# Patient Record
Sex: Male | Born: 1970 | Race: White | Hispanic: No | Marital: Single | Smoking: Former smoker
Health system: Southern US, Community
[De-identification: ages and names within clinical notes are randomized; demographics above are authoritative.]

## PROBLEM LIST (undated history)

## (undated) DIAGNOSIS — K219 Gastro-esophageal reflux disease without esophagitis: Secondary | ICD-10-CM

## (undated) DIAGNOSIS — F329 Major depressive disorder, single episode, unspecified: Secondary | ICD-10-CM

## (undated) DIAGNOSIS — F32A Depression, unspecified: Secondary | ICD-10-CM

## (undated) DIAGNOSIS — I10 Essential (primary) hypertension: Secondary | ICD-10-CM

## (undated) DIAGNOSIS — E785 Hyperlipidemia, unspecified: Secondary | ICD-10-CM

## (undated) DIAGNOSIS — F419 Anxiety disorder, unspecified: Secondary | ICD-10-CM

## (undated) DIAGNOSIS — E119 Type 2 diabetes mellitus without complications: Secondary | ICD-10-CM

## (undated) HISTORY — DX: Depression, unspecified: F32.A

## (undated) HISTORY — PX: UPPER GASTROINTESTINAL ENDOSCOPY: SHX188

## (undated) HISTORY — DX: Essential (primary) hypertension: I10

## (undated) HISTORY — DX: Major depressive disorder, single episode, unspecified: F32.9

## (undated) HISTORY — DX: Anxiety disorder, unspecified: F41.9

## (undated) HISTORY — DX: Type 2 diabetes mellitus without complications: E11.9

## (undated) HISTORY — DX: Hyperlipidemia, unspecified: E78.5

## (undated) HISTORY — DX: Gastro-esophageal reflux disease without esophagitis: K21.9

---

## 2004-03-04 ENCOUNTER — Ambulatory Visit: Payer: Self-pay | Admitting: General Surgery

## 2004-06-08 ENCOUNTER — Ambulatory Visit: Payer: Self-pay

## 2004-07-01 ENCOUNTER — Ambulatory Visit: Payer: Self-pay

## 2006-09-14 ENCOUNTER — Ambulatory Visit: Payer: Self-pay

## 2014-12-13 ENCOUNTER — Other Ambulatory Visit: Payer: Self-pay | Admitting: Family Medicine

## 2015-02-03 DIAGNOSIS — E785 Hyperlipidemia, unspecified: Secondary | ICD-10-CM | POA: Insufficient documentation

## 2015-02-03 DIAGNOSIS — F329 Major depressive disorder, single episode, unspecified: Secondary | ICD-10-CM | POA: Insufficient documentation

## 2015-02-03 DIAGNOSIS — F331 Major depressive disorder, recurrent, moderate: Secondary | ICD-10-CM | POA: Insufficient documentation

## 2015-02-03 DIAGNOSIS — I10 Essential (primary) hypertension: Secondary | ICD-10-CM | POA: Insufficient documentation

## 2015-02-03 DIAGNOSIS — F419 Anxiety disorder, unspecified: Secondary | ICD-10-CM | POA: Insufficient documentation

## 2015-02-03 DIAGNOSIS — F32A Depression, unspecified: Secondary | ICD-10-CM | POA: Insufficient documentation

## 2015-02-03 DIAGNOSIS — K219 Gastro-esophageal reflux disease without esophagitis: Secondary | ICD-10-CM | POA: Insufficient documentation

## 2015-02-10 ENCOUNTER — Ambulatory Visit (INDEPENDENT_AMBULATORY_CARE_PROVIDER_SITE_OTHER): Payer: Managed Care, Other (non HMO) | Admitting: Family Medicine

## 2015-02-10 ENCOUNTER — Encounter: Payer: Self-pay | Admitting: Family Medicine

## 2015-02-10 VITALS — BP 148/91 | HR 88 | Temp 97.9°F | Ht 70.2 in | Wt 283.0 lb

## 2015-02-10 DIAGNOSIS — F419 Anxiety disorder, unspecified: Secondary | ICD-10-CM | POA: Diagnosis not present

## 2015-02-10 DIAGNOSIS — Z0000 Encounter for general adult medical examination without abnormal findings: Secondary | ICD-10-CM

## 2015-02-10 DIAGNOSIS — I10 Essential (primary) hypertension: Secondary | ICD-10-CM

## 2015-02-10 DIAGNOSIS — E785 Hyperlipidemia, unspecified: Secondary | ICD-10-CM

## 2015-02-10 DIAGNOSIS — K219 Gastro-esophageal reflux disease without esophagitis: Secondary | ICD-10-CM | POA: Diagnosis not present

## 2015-02-10 DIAGNOSIS — F32A Depression, unspecified: Secondary | ICD-10-CM

## 2015-02-10 DIAGNOSIS — F329 Major depressive disorder, single episode, unspecified: Secondary | ICD-10-CM | POA: Diagnosis not present

## 2015-02-10 LAB — MICROSCOPIC EXAMINATION
Epithelial Cells (non renal): NONE SEEN /hpf (ref 0–10)
Renal Epithel, UA: NONE SEEN /hpf
WBC, UA: NONE SEEN /hpf (ref 0–?)

## 2015-02-10 LAB — URINALYSIS, ROUTINE W REFLEX MICROSCOPIC
Bilirubin, UA: NEGATIVE
Glucose, UA: NEGATIVE
Ketones, UA: NEGATIVE
Leukocytes, UA: NEGATIVE
Nitrite, UA: NEGATIVE
RBC, UA: NEGATIVE
Specific Gravity, UA: 1.02 (ref 1.005–1.030)
Urobilinogen, Ur: 0.2 mg/dL (ref 0.2–1.0)
pH, UA: 6.5 (ref 5.0–7.5)

## 2015-02-10 MED ORDER — DULOXETINE HCL 60 MG PO CPEP: 60.0000 mg | ORAL_CAPSULE | Freq: Every day | ORAL | Status: DC

## 2015-02-10 MED ORDER — ATENOLOL 100 MG PO TABS: 100.0000 mg | ORAL_TABLET | Freq: Every day | ORAL | Status: DC

## 2015-02-10 MED ORDER — ESOMEPRAZOLE MAGNESIUM 40 MG PO CPDR: 40.0000 mg | DELAYED_RELEASE_CAPSULE | Freq: Two times a day (BID) | ORAL | Status: DC

## 2015-02-10 MED ORDER — BENAZEPRIL HCL 40 MG PO TABS: 40.0000 mg | ORAL_TABLET | Freq: Every day | ORAL | Status: DC

## 2015-02-10 NOTE — Assessment & Plan Note (Signed)
His blood pressure being elevated will begin benazepril Patient education on diet exercise weight loss

## 2015-02-10 NOTE — Assessment & Plan Note (Signed)
Patient takes rare clonazepam

## 2015-02-10 NOTE — Assessment & Plan Note (Signed)
Diet controlled.  

## 2015-02-10 NOTE — Assessment & Plan Note (Signed)
The current medical regimen is effective;  continue present plan and medications.  

## 2015-02-10 NOTE — Progress Notes (Signed)
BP 148/91 mmHg  Pulse 88  Temp(Src) 97.9 F (36.6 C)  Ht 5' 10.2" (1.783 m)  Wt 283 lb (128.368 kg)  BMI 40.38 kg/m2  SpO2 98%   Subjective:    Patient ID: Aaron Robinson, male    DOB: 1970-08-08, 44 y.o.   MRN: [id number]  HPI: Aaron Robinson is a 44 y.o. male  Chief Complaint  Patient presents with  . Annual Exam   patient blood pressure with home checks sometimes elevated, sometimes doing well but mostly the lowest he gets is 140/85. No other complaints or concerns wants to get homocystine checked a cause family history of coronary artery disease.  Relevant past medical, surgical, family and social history reviewed and updated as indicated. Interim medical history since our last visit reviewed. Allergies and medications reviewed and updated.  Review of Systems  Constitutional: Negative.   HENT: Negative.   Eyes: Negative.   Respiratory: Negative.   Cardiovascular: Negative.   Endocrine: Negative.   Musculoskeletal: Negative.   Skin: Negative.   Allergic/Immunologic: Negative.   Neurological: Negative.   Hematological: Negative.   Psychiatric/Behavioral: Negative.     Per HPI unless specifically indicated above     Objective:    BP 148/91 mmHg  Pulse 88  Temp(Src) 97.9 F (36.6 C)  Ht 5' 10.2" (1.783 m)  Wt 283 lb (128.368 kg)  BMI 40.38 kg/m2  SpO2 98%  Wt Readings from Last 3 Encounters:  02/10/15 283 lb (128.368 kg)  05/29/14 277 lb (125.646 kg)    Physical Exam  Constitutional: He is oriented to person, place, and time. He appears well-developed and well-nourished.  HENT:  Head: Normocephalic and atraumatic.  Right Ear: External ear normal.  Left Ear: External ear normal.  Eyes: Conjunctivae and EOM are normal. Pupils are equal, round, and reactive to light.  Neck: Normal range of motion. Neck supple.  Cardiovascular: Normal rate, regular rhythm, normal heart sounds and intact distal pulses.   Pulmonary/Chest: Effort normal and breath  sounds normal.  Abdominal: Soft. Bowel sounds are normal. There is no splenomegaly or hepatomegaly.  Genitourinary: Rectum normal, prostate normal and penis normal.  Musculoskeletal: Normal range of motion.  Neurological: He is alert and oriented to person, place, and time. He has normal reflexes.  Skin: No rash noted. No erythema.  Psychiatric: He has a normal mood and affect. His behavior is normal. Judgment and thought content normal.    No results found for this or any previous visit.    Assessment & Plan:   Problem List Items Addressed This Visit      Cardiovascular and Mediastinum   Hypertension - Primary    His blood pressure being elevated will begin benazepril Patient education on diet exercise weight loss       Relevant Medications   atenolol (TENORMIN) 100 MG tablet   benazepril (LOTENSIN) 40 MG tablet     Digestive   GERD (gastroesophageal reflux disease)    The current medical regimen is effective;  continue present plan and medications.       Relevant Medications   esomeprazole (NEXIUM) 40 MG capsule     Other   Anxiety    Patient takes rare clonazepam      Relevant Medications   DULoxetine (CYMBALTA) 60 MG capsule   Hyperlipidemia    Diet-controlled      Relevant Medications   atenolol (TENORMIN) 100 MG tablet   benazepril (LOTENSIN) 40 MG tablet   Other Relevant Orders  Homocysteine   Depression    The current medical regimen is effective;  continue present plan and medications.       Relevant Medications   DULoxetine (CYMBALTA) 60 MG capsule    Other Visit Diagnoses    PE (physical exam), annual        Relevant Orders    Comprehensive metabolic panel    Lipid panel    CBC with Differential/Platelet    TSH    Urinalysis, Routine w reflex microscopic (not at [facility])    PSA        Follow up plan: Return in about 4 weeks (around 03/10/2015) for for BP check and BMP.

## 2015-02-11 ENCOUNTER — Encounter: Payer: Self-pay | Admitting: Family Medicine

## 2015-02-11 LAB — CBC WITH DIFFERENTIAL/PLATELET
Basophils Absolute: 0 10*3/uL (ref 0.0–0.2)
Basos: 0 %
EOS (ABSOLUTE): 0.4 10*3/uL (ref 0.0–0.4)
Eos: 4 %
Hematocrit: 45 % (ref 37.5–51.0)
Hemoglobin: 15.1 g/dL (ref 12.6–17.7)
Immature Grans (Abs): 0 10*3/uL (ref 0.0–0.1)
Immature Granulocytes: 0 %
Lymphocytes Absolute: 2.1 10*3/uL (ref 0.7–3.1)
Lymphs: 23 %
MCH: 27.6 pg (ref 26.6–33.0)
MCHC: 33.6 g/dL (ref 31.5–35.7)
MCV: 82 fL (ref 79–97)
Monocytes Absolute: 0.6 10*3/uL (ref 0.1–0.9)
Monocytes: 6 %
Neutrophils Absolute: 5.9 10*3/uL (ref 1.4–7.0)
Neutrophils: 67 %
Platelets: 275 10*3/uL (ref 150–379)
RBC: 5.48 x10E6/uL (ref 4.14–5.80)
RDW: 14.5 % (ref 12.3–15.4)
WBC: 9 10*3/uL (ref 3.4–10.8)

## 2015-02-11 LAB — COMPREHENSIVE METABOLIC PANEL
ALT: 34 IU/L (ref 0–44)
AST: 26 IU/L (ref 0–40)
Albumin/Globulin Ratio: 1.5 (ref 1.1–2.5)
Albumin: 4.6 g/dL (ref 3.5–5.5)
Alkaline Phosphatase: 70 IU/L (ref 39–117)
BUN/Creatinine Ratio: 10 (ref 9–20)
BUN: 10 mg/dL (ref 6–24)
Bilirubin Total: 0.5 mg/dL (ref 0.0–1.2)
CO2: 26 mmol/L (ref 18–29)
Calcium: 9.8 mg/dL (ref 8.7–10.2)
Chloride: 96 mmol/L — ABNORMAL LOW (ref 97–108)
Creatinine, Ser: 0.97 mg/dL (ref 0.76–1.27)
GFR calc Af Amer: 109 mL/min/{1.73_m2} (ref >59)
GFR calc non Af Amer: 95 mL/min/{1.73_m2} (ref >59)
Globulin, Total: 3 g/dL (ref 1.5–4.5)
Glucose: 121 mg/dL — ABNORMAL HIGH (ref 65–99)
Potassium: 4.9 mmol/L (ref 3.5–5.2)
Sodium: 139 mmol/L (ref 134–144)
Total Protein: 7.6 g/dL (ref 6.0–8.5)

## 2015-02-11 LAB — LIPID PANEL
Chol/HDL Ratio: 3.9 ratio units (ref 0.0–5.0)
Cholesterol, Total: 165 mg/dL (ref 100–199)
HDL: 42 mg/dL (ref >39)
LDL Calculated: 90 mg/dL (ref 0–99)
Triglycerides: 163 mg/dL — ABNORMAL HIGH (ref 0–149)
VLDL Cholesterol Cal: 33 mg/dL (ref 5–40)

## 2015-02-11 LAB — TSH: TSH: 1.42 u[IU]/mL (ref 0.450–4.500)

## 2015-02-11 LAB — HOMOCYSTEINE: Homocysteine: 9.4 umol/L (ref 0.0–15.0)

## 2015-02-11 LAB — PSA: Prostate Specific Ag, Serum: 0.5 ng/mL (ref 0.0–4.0)

## 2015-02-19 ENCOUNTER — Telehealth: Payer: Self-pay | Admitting: Family Medicine

## 2015-02-19 NOTE — Telephone Encounter (Signed)
Pt requests call back from [facility]. No further information provided. Thanks.

## 2015-03-13 ENCOUNTER — Encounter: Payer: Self-pay | Admitting: Family Medicine

## 2015-03-13 ENCOUNTER — Ambulatory Visit (INDEPENDENT_AMBULATORY_CARE_PROVIDER_SITE_OTHER): Payer: Managed Care, Other (non HMO) | Admitting: Family Medicine

## 2015-03-13 VITALS — BP 120/78 | HR 70 | Temp 97.9°F | Ht 70.0 in | Wt 280.0 lb

## 2015-03-13 DIAGNOSIS — I10 Essential (primary) hypertension: Secondary | ICD-10-CM

## 2015-03-13 MED ORDER — BENAZEPRIL HCL 20 MG PO TABS: 20.0000 mg | ORAL_TABLET | Freq: Every day | ORAL | Status: DC

## 2015-03-13 NOTE — Assessment & Plan Note (Signed)
Discussed blood pressure doing really well patient will continue lifestyle changes weight loss diet exercise nutrition Will do a trial of 20 mg benazepril observing blood pressure We'll give refills for 6 months with 20 mg but may need to change back to 40 depending patient's blood pressure response.

## 2015-03-13 NOTE — Progress Notes (Signed)
BP 120/78 mmHg  Pulse 70  Temp(Src) 97.9 F (36.6 C)  Ht 5\' 10"  (1.778 m)  Wt 280 lb (127.007 kg)  BMI 40.18 kg/m2  SpO2 97%   Subjective:    Patient ID: Aaron Robinson, male    DOB: 01/21/1971, 44 y.o.   MRN: [id number]  HPI: Aaron Robinson is a 44 y.o. male  Chief Complaint  Patient presents with  . Hypertension   recheck blood pressures doing well with no side effects taking medications faithfully every day is interested in cutting down to 20 mg is already lost 3 pounds since last visit.  Relevant past medical, surgical, family and social history reviewed and updated as indicated. Interim medical history since our last visit reviewed. Allergies and medications reviewed and updated.  Review of Systems Negative Per HPI unless specifically indicated above     Objective:    BP 120/78 mmHg  Pulse 70  Temp(Src) 97.9 F (36.6 C)  Ht 5\' 10"  (1.778 m)  Wt 280 lb (127.007 kg)  BMI 40.18 kg/m2  SpO2 97%  Wt Readings from Last 3 Encounters:  03/13/15 280 lb (127.007 kg)  02/10/15 283 lb (128.368 kg)  05/29/14 277 lb (125.646 kg)    Physical Exam  Constitutional: He is oriented to person, place, and time. He appears well-developed and well-nourished. No distress.  HENT:  Head: Normocephalic and atraumatic.  Right Ear: Hearing normal.  Left Ear: Hearing normal.  Nose: Nose normal.  Eyes: Conjunctivae and lids are normal. Right eye exhibits no discharge. Left eye exhibits no discharge. No scleral icterus.  Cardiovascular: Normal rate, regular rhythm and normal heart sounds.   Pulmonary/Chest: Effort normal and breath sounds normal. No respiratory distress.  Musculoskeletal: Normal range of motion.  Neurological: He is alert and oriented to person, place, and time.  Skin: Skin is intact. No rash noted.  Psychiatric: He has a normal mood and affect. His speech is normal and behavior is normal. Judgment and thought content normal. Cognition and memory are normal.     Results for orders placed or performed in visit on 02/10/15  Microscopic Examination  Result Value Ref Range   WBC, UA None seen 0 -  5 /hpf   RBC, UA 0-2 0 -  2 /hpf   Epithelial Cells (non renal) None seen 0 - 10 /hpf   Renal Epithel, UA None seen None seen /hpf   Casts Present (A) None seen /lpf   Cast Type Hyaline casts N/A   Bacteria, UA Few None seen/Few  Comprehensive metabolic panel  Result Value Ref Range   Glucose 121 (H) 65 - 99 mg/dL   BUN 10 6 - 24 mg/dL   Creatinine, Ser 0.97 0.76 - 1.27 mg/dL   GFR calc non Af Amer 95 >59 mL/min/1.73   GFR calc Af Amer 109 >59 mL/min/1.73   BUN/Creatinine Ratio 10 9 - 20   Sodium 139 134 - 144 mmol/L   Potassium 4.9 3.5 - 5.2 mmol/L   Chloride 96 (L) 97 - 108 mmol/L   CO2 26 18 - 29 mmol/L   Calcium 9.8 8.7 - 10.2 mg/dL   Total Protein 7.6 6.0 - 8.5 g/dL   Albumin 4.6 3.5 - 5.5 g/dL   Globulin, Total 3.0 1.5 - 4.5 g/dL   Albumin/Globulin Ratio 1.5 1.1 - 2.5   Bilirubin Total 0.5 0.0 - 1.2 mg/dL   Alkaline Phosphatase 70 39 - 117 IU/L   AST 26 0 - 40 IU/L  ALT 34 0 - 44 IU/L  Lipid panel  Result Value Ref Range   Cholesterol, Total 165 100 - 199 mg/dL   Triglycerides 163 (H) 0 - 149 mg/dL   HDL 42 >39 mg/dL   VLDL Cholesterol Cal 33 5 - 40 mg/dL   LDL Calculated 90 0 - 99 mg/dL   Chol/HDL Ratio 3.9 0.0 - 5.0 ratio units  CBC with Differential/Platelet  Result Value Ref Range   WBC 9.0 3.4 - 10.8 x10E3/uL   RBC 5.48 4.14 - 5.80 x10E6/uL   Hemoglobin 15.1 12.6 - 17.7 g/dL   Hematocrit 45.0 37.5 - 51.0 %   MCV 82 79 - 97 fL   MCH 27.6 26.6 - 33.0 pg   MCHC 33.6 31.5 - 35.7 g/dL   RDW 14.5 12.3 - 15.4 %   Platelets 275 150 - 379 x10E3/uL   Neutrophils 67 %   Lymphs 23 %   Monocytes 6 %   Eos 4 %   Basos 0 %   Neutrophils Absolute 5.9 1.4 - 7.0 x10E3/uL   Lymphocytes Absolute 2.1 0.7 - 3.1 x10E3/uL   Monocytes Absolute 0.6 0.1 - 0.9 x10E3/uL   EOS (ABSOLUTE) 0.4 0.0 - 0.4 x10E3/uL   Basophils Absolute 0.0 0.0  - 0.2 x10E3/uL   Immature Granulocytes 0 %   Immature Grans (Abs) 0.0 0.0 - 0.1 x10E3/uL  TSH  Result Value Ref Range   TSH 1.420 0.450 - 4.500 uIU/mL  Urinalysis, Routine w reflex microscopic (not at [facility])  Result Value Ref Range   Specific Gravity, UA 1.020 1.005 - 1.030   pH, UA 6.5 5.0 - 7.5   Color, UA Yellow Yellow   Appearance Ur Clear Clear   Leukocytes, UA Negative Negative   Protein, UA 2+ (A) Negative/Trace   Glucose, UA Negative Negative   Ketones, UA Negative Negative   RBC, UA Negative Negative   Bilirubin, UA Negative Negative   Urobilinogen, Ur 0.2 0.2 - 1.0 mg/dL   Nitrite, UA Negative Negative   Microscopic Examination See below:   PSA  Result Value Ref Range   Prostate Specific Ag, Serum 0.5 0.0 - 4.0 ng/mL  Homocysteine  Result Value Ref Range   Homocysteine 9.4 0.0 - 15.0 umol/L      Assessment & Plan:   Problem List Items Addressed This Visit      Cardiovascular and Mediastinum   Hypertension   Relevant Medications   benazepril (LOTENSIN) 20 MG tablet    Other Visit Diagnoses    Essential hypertension, benign    -  Primary    Relevant Medications    benazepril (LOTENSIN) 20 MG tablet    Other Relevant Orders    Basic metabolic panel        Follow up plan: Return in about 6 months (around 09/11/2015) for Recheck blood pressure and BMP.

## 2015-03-14 LAB — BASIC METABOLIC PANEL
BUN/Creatinine Ratio: 10 (ref 9–20)
BUN: 9 mg/dL (ref 6–24)
CO2: 24 mmol/L (ref 18–29)
Calcium: 9.3 mg/dL (ref 8.7–10.2)
Chloride: 96 mmol/L — ABNORMAL LOW (ref 97–106)
Creatinine, Ser: 0.93 mg/dL (ref 0.76–1.27)
GFR calc Af Amer: 115 mL/min/{1.73_m2} (ref >59)
GFR calc non Af Amer: 99 mL/min/{1.73_m2} (ref >59)
Glucose: 87 mg/dL (ref 65–99)
Potassium: 4.5 mmol/L (ref 3.5–5.2)
Sodium: 139 mmol/L (ref 136–144)

## 2015-03-17 ENCOUNTER — Encounter: Payer: Self-pay | Admitting: Family Medicine

## 2015-05-28 ENCOUNTER — Other Ambulatory Visit: Payer: Self-pay

## 2015-05-28 DIAGNOSIS — I10 Essential (primary) hypertension: Secondary | ICD-10-CM

## 2015-05-28 MED ORDER — BENAZEPRIL HCL 20 MG PO TABS: 20.0000 mg | ORAL_TABLET | Freq: Every day | ORAL | Status: DC

## 2015-05-28 NOTE — Telephone Encounter (Addendum)
From [place] Last Visit: 03/13/2015 Next Appt: 09/11/2015 Rx #: [id number]  Request for benazepril 20 mg tablet.   Rx was written in our system for 90 tabs with 2 refills on 03/14/2015. The pharmacy has that it was written for only 30 tabs with 2 refills. Which is why the request is soon. The patient ran out of medication 05/14/2015 (with 30 per 30 x's 2 refills). Can you renew this prescription?  I have the copy of the request from the pharmacy.

## 2015-09-11 ENCOUNTER — Ambulatory Visit (INDEPENDENT_AMBULATORY_CARE_PROVIDER_SITE_OTHER): Payer: Managed Care, Other (non HMO) | Admitting: Family Medicine

## 2015-09-11 ENCOUNTER — Encounter: Payer: Self-pay | Admitting: Family Medicine

## 2015-09-11 ENCOUNTER — Other Ambulatory Visit: Payer: Self-pay | Admitting: Family Medicine

## 2015-09-11 VITALS — BP 126/77 | HR 67 | Temp 98.8°F | Ht 71.3 in | Wt 276.0 lb

## 2015-09-11 DIAGNOSIS — I10 Essential (primary) hypertension: Secondary | ICD-10-CM | POA: Diagnosis not present

## 2015-09-11 DIAGNOSIS — F329 Major depressive disorder, single episode, unspecified: Secondary | ICD-10-CM | POA: Diagnosis not present

## 2015-09-11 DIAGNOSIS — F32A Depression, unspecified: Secondary | ICD-10-CM

## 2015-09-11 MED ORDER — BENAZEPRIL HCL 10 MG PO TABS: 10.0000 mg | ORAL_TABLET | Freq: Every day | ORAL | Status: DC

## 2015-09-11 MED ORDER — DULOXETINE HCL 30 MG PO CPEP: 30.0000 mg | ORAL_CAPSULE | Freq: Every day | ORAL | Status: DC

## 2015-09-11 NOTE — Assessment & Plan Note (Signed)
Doing well will continue Cymbalta at lower dose 30 mg observe response a try stopping after 1 month or so.

## 2015-09-11 NOTE — Progress Notes (Signed)
BP 126/77 mmHg  Pulse 67  Temp(Src) 98.8 F (37.1 C)  Ht 5' 11.3" (1.811 m)  Wt 276 lb (125.193 kg)  BMI 38.17 kg/m2  SpO2 97%   Subjective:    Patient ID: Aaron Robinson, male    DOB: 1970-07-28, 45 y.o.   MRN: [id number]  HPI: Aaron Robinson is a 45 y.o. male  Chief Complaint  Patient presents with  . Hypertension  Patient doing well with hypertension home blood pressure checking doing well and low has lost some weight interested in reducing doses. Like to cut back Benzapril either stop her down to 10 mg Atenolol helps with palpitations and anxiety wants to continue same dose. Depression doing well sooner as in cutting back to 30 mg of Cymbalta  Relevant past medical, surgical, family and social history reviewed and updated as indicated. Interim medical history since our last visit reviewed. Allergies and medications reviewed and updated.  Review of Systems  Constitutional: Negative.   Respiratory: Negative.   Cardiovascular: Negative.     Per HPI unless specifically indicated above     Objective:    BP 126/77 mmHg  Pulse 67  Temp(Src) 98.8 F (37.1 C)  Ht 5' 11.3" (1.811 m)  Wt 276 lb (125.193 kg)  BMI 38.17 kg/m2  SpO2 97%  Wt Readings from Last 3 Encounters:  09/11/15 276 lb (125.193 kg)  03/13/15 280 lb (127.007 kg)  02/10/15 283 lb (128.368 kg)    Physical Exam  Constitutional: He is oriented to person, place, and time. He appears well-developed and well-nourished. No distress.  HENT:  Head: Normocephalic and atraumatic.  Right Ear: Hearing normal.  Left Ear: Hearing normal.  Nose: Nose normal.  Eyes: Conjunctivae and lids are normal. Right eye exhibits no discharge. Left eye exhibits no discharge. No scleral icterus.  Cardiovascular: Normal rate, regular rhythm and normal heart sounds.   Pulmonary/Chest: Effort normal and breath sounds normal. No respiratory distress.  Musculoskeletal: Normal range of motion.  Neurological: He is alert  and oriented to person, place, and time.  Skin: Skin is intact. No rash noted.  Psychiatric: He has a normal mood and affect. His speech is normal and behavior is normal. Judgment and thought content normal. Cognition and memory are normal.    Results for orders placed or performed in visit on [id number]  Basic metabolic panel  Result Value Ref Range   Glucose 87 65 - 99 mg/dL   BUN 9 6 - 24 mg/dL   Creatinine, Ser 0.93 0.76 - 1.27 mg/dL   GFR calc non Af Amer 99 >59 mL/min/1.73   GFR calc Af Amer 115 >59 mL/min/1.73   BUN/Creatinine Ratio 10 9 - 20   Sodium 139 136 - 144 mmol/L   Potassium 4.5 3.5 - 5.2 mmol/L   Chloride 96 (L) 97 - 106 mmol/L   CO2 24 18 - 29 mmol/L   Calcium 9.3 8.7 - 10.2 mg/dL      Assessment & Plan:   Problem List Items Addressed This Visit      Cardiovascular and Mediastinum   Hypertension - Primary    Discuss medication will decrease Benzapril to 10 mg observe blood pressure      Relevant Medications   benazepril (LOTENSIN) 10 MG tablet   Other Relevant Orders   Basic metabolic panel     Other   Depression    Doing well will continue Cymbalta at lower dose 30 mg observe response a try stopping after 1 month  or so.      Relevant Medications   DULoxetine (CYMBALTA) 30 MG capsule      Discussed diet exercise continued weight loss if medicine change not satisfactory left old medicines in chart can go back to hold her dose Follow up plan: Return in about 6 months (around 03/12/2016) for Physical Exam.

## 2015-09-11 NOTE — Assessment & Plan Note (Signed)
Discuss medication will decrease Benzapril to 10 mg observe blood pressure

## 2015-09-12 LAB — BASIC METABOLIC PANEL
BUN/Creatinine Ratio: 11 (ref 9–20)
BUN: 10 mg/dL (ref 6–24)
CO2: 27 mmol/L (ref 18–29)
Calcium: 9.6 mg/dL (ref 8.7–10.2)
Chloride: 101 mmol/L (ref 96–106)
Creatinine, Ser: 0.87 mg/dL (ref 0.76–1.27)
GFR calc Af Amer: 120 mL/min/{1.73_m2} (ref >59)
GFR calc non Af Amer: 104 mL/min/{1.73_m2} (ref >59)
Glucose: 89 mg/dL (ref 65–99)
Potassium: 4.5 mmol/L (ref 3.5–5.2)
Sodium: 142 mmol/L (ref 134–144)

## 2015-09-15 ENCOUNTER — Encounter: Payer: Self-pay | Admitting: Family Medicine

## 2015-09-23 ENCOUNTER — Encounter: Payer: Self-pay | Admitting: Family Medicine

## 2016-02-11 ENCOUNTER — Encounter: Payer: Self-pay | Admitting: Family Medicine

## 2016-02-11 ENCOUNTER — Ambulatory Visit (INDEPENDENT_AMBULATORY_CARE_PROVIDER_SITE_OTHER): Payer: Managed Care, Other (non HMO) | Admitting: Family Medicine

## 2016-02-11 VITALS — BP 128/86 | HR 78 | Temp 98.1°F | Ht 70.25 in | Wt 286.5 lb

## 2016-02-11 DIAGNOSIS — F329 Major depressive disorder, single episode, unspecified: Secondary | ICD-10-CM

## 2016-02-11 DIAGNOSIS — I10 Essential (primary) hypertension: Secondary | ICD-10-CM

## 2016-02-11 DIAGNOSIS — K219 Gastro-esophageal reflux disease without esophagitis: Secondary | ICD-10-CM

## 2016-02-11 DIAGNOSIS — Z0000 Encounter for general adult medical examination without abnormal findings: Secondary | ICD-10-CM | POA: Diagnosis not present

## 2016-02-11 DIAGNOSIS — F32A Depression, unspecified: Secondary | ICD-10-CM

## 2016-02-11 LAB — URINALYSIS, ROUTINE W REFLEX MICROSCOPIC
Bilirubin, UA: NEGATIVE
Glucose, UA: NEGATIVE
Ketones, UA: NEGATIVE
Leukocytes, UA: NEGATIVE
Nitrite, UA: NEGATIVE
Protein, UA: NEGATIVE
RBC, UA: NEGATIVE
Specific Gravity, UA: <1.005 — ABNORMAL LOW (ref 1.005–1.030)
Urobilinogen, Ur: 0.2 mg/dL (ref 0.2–1.0)
pH, UA: 5.5 (ref 5.0–7.5)

## 2016-02-11 MED ORDER — ESOMEPRAZOLE MAGNESIUM 40 MG PO CPDR: 40.0000 mg | DELAYED_RELEASE_CAPSULE | Freq: Two times a day (BID) | ORAL | 4 refills | Status: DC

## 2016-02-11 MED ORDER — DULOXETINE HCL 60 MG PO CPEP: 60.0000 mg | ORAL_CAPSULE | Freq: Every day | ORAL | 4 refills | Status: DC

## 2016-02-11 MED ORDER — DULOXETINE HCL 30 MG PO CPEP: 30.0000 mg | ORAL_CAPSULE | Freq: Every day | ORAL | 0 refills | Status: DC

## 2016-02-11 MED ORDER — CLONAZEPAM 1 MG PO TABS: 1.0000 mg | ORAL_TABLET | Freq: Every day | ORAL | 0 refills | Status: DC | PRN

## 2016-02-11 MED ORDER — BENAZEPRIL HCL 10 MG PO TABS: 10.0000 mg | ORAL_TABLET | Freq: Every day | ORAL | 4 refills | Status: DC

## 2016-02-11 MED ORDER — ATENOLOL 100 MG PO TABS: 100.0000 mg | ORAL_TABLET | Freq: Every day | ORAL | 4 refills | Status: DC

## 2016-02-11 NOTE — Assessment & Plan Note (Signed)
Discussed with OCD symptoms and recurrence of symptoms strong family history previous response to Cymbalta and family's response to Cymbalta will restart Cymbalta.

## 2016-02-11 NOTE — Progress Notes (Signed)
BP 128/86 (BP Location: Left Arm, Patient Position: Sitting, Cuff Size: Normal)   Pulse 78   Temp 98.1 F (36.7 C)   Ht 5' 10.25" (1.784 m)   Wt 286 lb 8 oz (130 kg)   SpO2 98%   BMI 40.82 kg/m    Subjective:    Patient ID: Aaron Robinson, male    DOB: 1970-09-10, 45 y.o.   MRN: [id number]  HPI: Aaron Robinson is a 45 y.o. male  Chief Complaint  Patient presents with  . Annual Exam   Patient with ordinarily doing well but over this last summer has been developing marked recurrence of OCD anxiety and depression this is previously been controlled with Cymbalta and wants to get back on this medicine as that did the best for his symptoms. As also developed GI symptoms related as he knows now to anxiety. Hasn't had clonazepam filled in over a year but had some old ones is used occasionally continues to use Nexium without problems blood pressure is been checking with OCD symptoms up to 10 times a day and sometimes elevated. Otherwise good   Relevant past medical, surgical, family and social history reviewed and updated as indicated. Interim medical history since our last visit reviewed. Allergies and medications reviewed and updated.  Review of Systems  Constitutional: Negative.   HENT: Negative.   Eyes: Negative.   Respiratory: Negative.   Cardiovascular: Negative.   Gastrointestinal: Negative.   Endocrine: Negative.   Genitourinary: Negative.   Musculoskeletal: Negative.   Skin: Negative.   Allergic/Immunologic: Negative.   Neurological: Negative.   Hematological: Negative.   Psychiatric/Behavioral: Negative.     Per HPI unless specifically indicated above     Objective:    BP 128/86 (BP Location: Left Arm, Patient Position: Sitting, Cuff Size: Normal)   Pulse 78   Temp 98.1 F (36.7 C)   Ht 5' 10.25" (1.784 m)   Wt 286 lb 8 oz (130 kg)   SpO2 98%   BMI 40.82 kg/m   Wt Readings from Last 3 Encounters:  02/11/16 286 lb 8 oz (130 kg)  09/11/15 276 lb  (125.2 kg)  03/13/15 280 lb (127 kg)    Physical Exam  Constitutional: He is oriented to person, place, and time. He appears well-developed and well-nourished.  HENT:  Head: Normocephalic and atraumatic.  Right Ear: External ear normal.  Left Ear: External ear normal.  Eyes: Conjunctivae and EOM are normal. Pupils are equal, round, and reactive to light.  Neck: Normal range of motion. Neck supple.  Cardiovascular: Normal rate, regular rhythm, normal heart sounds and intact distal pulses.   Pulmonary/Chest: Effort normal and breath sounds normal.  Abdominal: Soft. Bowel sounds are normal. There is no splenomegaly or hepatomegaly.  Genitourinary: Rectum normal, prostate normal and penis normal.  Musculoskeletal: Normal range of motion.  Neurological: He is alert and oriented to person, place, and time. He has normal reflexes.  Skin: No rash noted. No erythema.  Psychiatric: He has a normal mood and affect. His behavior is normal. Judgment and thought content normal.    Results for orders placed or performed in visit on [id number]  Basic metabolic panel  Result Value Ref Range   Glucose 89 65 - 99 mg/dL   BUN 10 6 - 24 mg/dL   Creatinine, Ser 0.87 0.76 - 1.27 mg/dL   GFR calc non Af Amer 104 >59 mL/min/1.73   GFR calc Af Amer 120 >59 mL/min/1.73   BUN/Creatinine Ratio 11 9 -  20   Sodium 142 134 - 144 mmol/L   Potassium 4.5 3.5 - 5.2 mmol/L   Chloride 101 96 - 106 mmol/L   CO2 27 18 - 29 mmol/L   Calcium 9.6 8.7 - 10.2 mg/dL      Assessment & Plan:   Problem List Items Addressed This Visit      Cardiovascular and Mediastinum   Hypertension    The current medical regimen is effective;  continue present plan and medications.       Relevant Medications   benazepril (LOTENSIN) 10 MG tablet   atenolol (TENORMIN) 100 MG tablet     Digestive   GERD (gastroesophageal reflux disease)   Relevant Medications   esomeprazole (NEXIUM) 40 MG capsule     Other   Depression     Discussed with OCD symptoms and recurrence of symptoms strong family history previous response to Cymbalta and family's response to Cymbalta will restart Cymbalta.      Relevant Medications   DULoxetine (CYMBALTA) 60 MG capsule   DULoxetine (CYMBALTA) 30 MG capsule   clonazePAM (KLONOPIN) 1 MG tablet    Other Visit Diagnoses    PE (physical exam), annual    -  Primary   Relevant Orders   Comprehensive metabolic panel   Lipid panel   CBC with Differential/Platelet   TSH   Urinalysis, Routine w reflex microscopic (not at [facility])   PSA   Homocysteine       Follow up plan: Return in about 6 months (around 08/10/2016) for BMP.

## 2016-02-11 NOTE — Assessment & Plan Note (Signed)
The current medical regimen is effective;  continue present plan and medications.  

## 2016-02-12 ENCOUNTER — Encounter: Payer: Self-pay | Admitting: Family Medicine

## 2016-02-12 LAB — LIPID PANEL
Chol/HDL Ratio: 3.7 ratio units (ref 0.0–5.0)
Cholesterol, Total: 152 mg/dL (ref 100–199)
HDL: 41 mg/dL (ref >39)
LDL Calculated: 86 mg/dL (ref 0–99)
Triglycerides: 124 mg/dL (ref 0–149)
VLDL Cholesterol Cal: 25 mg/dL (ref 5–40)

## 2016-02-12 LAB — COMPREHENSIVE METABOLIC PANEL
ALT: 29 IU/L (ref 0–44)
AST: 20 IU/L (ref 0–40)
Albumin/Globulin Ratio: 1.3 (ref 1.2–2.2)
Albumin: 4.3 g/dL (ref 3.5–5.5)
Alkaline Phosphatase: 70 IU/L (ref 39–117)
BUN/Creatinine Ratio: 15 (ref 9–20)
BUN: 14 mg/dL (ref 6–24)
Bilirubin Total: 0.4 mg/dL (ref 0.0–1.2)
CO2: 26 mmol/L (ref 18–29)
Calcium: 9.4 mg/dL (ref 8.7–10.2)
Chloride: 97 mmol/L (ref 96–106)
Creatinine, Ser: 0.95 mg/dL (ref 0.76–1.27)
GFR calc Af Amer: 111 mL/min/{1.73_m2} (ref >59)
GFR calc non Af Amer: 96 mL/min/{1.73_m2} (ref >59)
Globulin, Total: 3.4 g/dL (ref 1.5–4.5)
Glucose: 133 mg/dL — ABNORMAL HIGH (ref 65–99)
Potassium: 4.9 mmol/L (ref 3.5–5.2)
Sodium: 137 mmol/L (ref 134–144)
Total Protein: 7.7 g/dL (ref 6.0–8.5)

## 2016-02-12 LAB — HOMOCYSTEINE: Homocysteine: 9 umol/L (ref 0.0–15.0)

## 2016-02-12 LAB — CBC WITH DIFFERENTIAL/PLATELET

## 2016-02-12 LAB — TSH: TSH: 1.47 u[IU]/mL (ref 0.450–4.500)

## 2016-02-12 LAB — PSA: Prostate Specific Ag, Serum: 0.4 ng/mL (ref 0.0–4.0)

## 2016-06-01 ENCOUNTER — Other Ambulatory Visit: Payer: Self-pay | Admitting: Family Medicine

## 2016-06-01 DIAGNOSIS — F32A Depression, unspecified: Secondary | ICD-10-CM

## 2016-06-01 DIAGNOSIS — F329 Major depressive disorder, single episode, unspecified: Secondary | ICD-10-CM

## 2016-06-01 NOTE — Telephone Encounter (Signed)
OK to call in

## 2016-06-02 NOTE — Telephone Encounter (Signed)
RX called in .

## 2016-08-11 ENCOUNTER — Encounter: Payer: Self-pay | Admitting: Family Medicine

## 2016-08-11 ENCOUNTER — Ambulatory Visit (INDEPENDENT_AMBULATORY_CARE_PROVIDER_SITE_OTHER): Payer: Managed Care, Other (non HMO) | Admitting: Family Medicine

## 2016-08-11 VITALS — BP 137/80 | HR 88 | Ht 70.0 in | Wt 281.0 lb

## 2016-08-11 DIAGNOSIS — F419 Anxiety disorder, unspecified: Secondary | ICD-10-CM | POA: Diagnosis not present

## 2016-08-11 DIAGNOSIS — E785 Hyperlipidemia, unspecified: Secondary | ICD-10-CM

## 2016-08-11 DIAGNOSIS — F329 Major depressive disorder, single episode, unspecified: Secondary | ICD-10-CM

## 2016-08-11 DIAGNOSIS — I10 Essential (primary) hypertension: Secondary | ICD-10-CM

## 2016-08-11 DIAGNOSIS — F32A Depression, unspecified: Secondary | ICD-10-CM

## 2016-08-11 MED ORDER — CLONAZEPAM 1 MG PO TABS: 1.0000 mg | ORAL_TABLET | Freq: Every day | ORAL | 0 refills | Status: DC | PRN

## 2016-08-11 NOTE — Assessment & Plan Note (Signed)
The current medical regimen is effective;  continue present plan and medications.  

## 2016-08-11 NOTE — Progress Notes (Signed)
BP 137/80 (BP Location: Left Arm)   Pulse 88   Ht 5\' 10"  (1.778 m)   Wt 281 lb (127.5 kg)   SpO2 96%   BMI 40.32 kg/m    Subjective:    Patient ID: Aaron Robinson, male    DOB: [birth year], 46 y.o.   MRN: [id number]  HPI: Aaron Robinson is a 46 y.o. male  Chief Complaint  Patient presents with  . Follow-up  . Hypertension   Hypertension doing well no complaints with medications taken faithfully without problems Anxiety depression stable Xanax lasts about a year or so. All in all doing well. Relevant past medical, surgical, family and social history reviewed and updated as indicated. Interim medical history since our last visit reviewed. Allergies and medications reviewed and updated.  Review of Systems  Constitutional: Negative.   Respiratory: Negative.   Cardiovascular: Negative.     Per HPI unless specifically indicated above     Objective:    BP 137/80 (BP Location: Left Arm)   Pulse 88   Ht 5\' 10"  (1.778 m)   Wt 281 lb (127.5 kg)   SpO2 96%   BMI 40.32 kg/m   Wt Readings from Last 3 Encounters:  08/11/16 281 lb (127.5 kg)  02/11/16 286 lb 8 oz (130 kg)  09/11/15 276 lb (125.2 kg)    Physical Exam  Constitutional: He is oriented to person, place, and time. He appears well-developed and well-nourished.  HENT:  Head: Normocephalic and atraumatic.  Eyes: Conjunctivae and EOM are normal.  Neck: Normal range of motion.  Cardiovascular: Normal rate, regular rhythm and normal heart sounds.   Pulmonary/Chest: Effort normal and breath sounds normal.  Musculoskeletal: Normal range of motion.  Neurological: He is alert and oriented to person, place, and time.  Skin: No erythema.  Psychiatric: He has a normal mood and affect. His behavior is normal. Judgment and thought content normal.    Results for orders placed or performed in visit on 02/11/16  Comprehensive metabolic panel  Result Value Ref Range   Glucose 133 (H) 65 - 99 mg/dL   BUN 14 6 - 24  mg/dL   Creatinine, Ser 0.95 0.76 - 1.27 mg/dL   GFR calc non Af Amer 96 >59 mL/min/1.73   GFR calc Af Amer 111 >59 mL/min/1.73   BUN/Creatinine Ratio 15 9 - 20   Sodium 137 134 - 144 mmol/L   Potassium 4.9 3.5 - 5.2 mmol/L   Chloride 97 96 - 106 mmol/L   CO2 26 18 - 29 mmol/L   Calcium 9.4 8.7 - 10.2 mg/dL   Total Protein 7.7 6.0 - 8.5 g/dL   Albumin 4.3 3.5 - 5.5 g/dL   Globulin, Total 3.4 1.5 - 4.5 g/dL   Albumin/Globulin Ratio 1.3 1.2 - 2.2   Bilirubin Total 0.4 0.0 - 1.2 mg/dL   Alkaline Phosphatase 70 39 - 117 IU/L   AST 20 0 - 40 IU/L   ALT 29 0 - 44 IU/L  Lipid panel  Result Value Ref Range   Cholesterol, Total 152 100 - 199 mg/dL   Triglycerides 124 0 - 149 mg/dL   HDL 41 >39 mg/dL   VLDL Cholesterol Cal 25 5 - 40 mg/dL   LDL Calculated 86 0 - 99 mg/dL   Chol/HDL Ratio 3.7 0.0 - 5.0 ratio units  CBC with Differential/Platelet  Result Value Ref Range   WBC CANCELED x10E3/uL  TSH  Result Value Ref Range   TSH 1.470 0.450 -  4.500 uIU/mL  Urinalysis, Routine w reflex microscopic (not at [facility])  Result Value Ref Range   Specific Gravity, UA <1.005 (L) 1.005 - 1.030   pH, UA 5.5 5.0 - 7.5   Color, UA Yellow Yellow   Appearance Ur Clear Clear   Leukocytes, UA Negative Negative   Protein, UA Negative Negative/Trace   Glucose, UA Negative Negative   Ketones, UA Negative Negative   RBC, UA Negative Negative   Bilirubin, UA Negative Negative   Urobilinogen, Ur 0.2 0.2 - 1.0 mg/dL   Nitrite, UA Negative Negative  PSA  Result Value Ref Range   Prostate Specific Ag, Serum 0.4 0.0 - 4.0 ng/mL  Homocysteine  Result Value Ref Range   Homocysteine 9.0 0.0 - 15.0 umol/L      Assessment & Plan:   Problem List Items Addressed This Visit      Cardiovascular and Mediastinum   Hypertension - Primary    The current medical regimen is effective;  continue present plan and medications.       Relevant Orders   Basic metabolic panel     Other   Anxiety    Discuss Xanax  use will continue given a refill.      Hyperlipidemia   Relevant Orders   Basic metabolic panel   Depression    The current medical regimen is effective;  continue present plan and medications.       Relevant Medications   clonazePAM (KLONOPIN) 1 MG tablet       Follow up plan: Return in about 6 months (around 02/11/2017) for Physical Exam.

## 2016-08-11 NOTE — Assessment & Plan Note (Signed)
Discuss Xanax use will continue given a refill.

## 2016-08-12 ENCOUNTER — Telehealth: Payer: Self-pay | Admitting: Family Medicine

## 2016-08-12 DIAGNOSIS — R7309 Other abnormal glucose: Secondary | ICD-10-CM

## 2016-08-12 LAB — BASIC METABOLIC PANEL
BUN/Creatinine Ratio: 9 (ref 9–20)
BUN: 10 mg/dL (ref 6–24)
CO2: 25 mmol/L (ref 18–29)
Calcium: 9.4 mg/dL (ref 8.7–10.2)
Chloride: 96 mmol/L (ref 96–106)
Creatinine, Ser: 1.07 mg/dL (ref 0.76–1.27)
GFR calc Af Amer: 96 mL/min/{1.73_m2} (ref >59)
GFR calc non Af Amer: 83 mL/min/{1.73_m2} (ref >59)
Glucose: 161 mg/dL — ABNORMAL HIGH (ref 65–99)
Potassium: 4.2 mmol/L (ref 3.5–5.2)
Sodium: 136 mmol/L (ref 134–144)

## 2016-08-12 NOTE — Telephone Encounter (Signed)
Phone call Discussed with patient elevated glucose nonfasting, but 6 months ago glucose was also elevated. Patient will come back for hemoglobin A1c.

## 2016-08-17 ENCOUNTER — Other Ambulatory Visit: Payer: Managed Care, Other (non HMO)

## 2016-08-17 DIAGNOSIS — R7309 Other abnormal glucose: Secondary | ICD-10-CM

## 2016-08-18 LAB — HGB A1C W/O EAG: Hgb A1c MFr Bld: 6.9 % — ABNORMAL HIGH (ref 4.8–5.6)

## 2016-08-19 ENCOUNTER — Encounter: Payer: Self-pay | Admitting: Family Medicine

## 2016-08-19 DIAGNOSIS — E119 Type 2 diabetes mellitus without complications: Secondary | ICD-10-CM

## 2016-08-19 DIAGNOSIS — R7303 Prediabetes: Secondary | ICD-10-CM | POA: Insufficient documentation

## 2016-08-19 DIAGNOSIS — E118 Type 2 diabetes mellitus with unspecified complications: Secondary | ICD-10-CM | POA: Insufficient documentation

## 2016-08-24 ENCOUNTER — Encounter: Payer: Self-pay | Admitting: Family Medicine

## 2016-11-22 ENCOUNTER — Encounter: Payer: Self-pay | Admitting: Family Medicine

## 2016-11-23 ENCOUNTER — Ambulatory Visit (INDEPENDENT_AMBULATORY_CARE_PROVIDER_SITE_OTHER): Payer: Managed Care, Other (non HMO) | Admitting: Family Medicine

## 2016-11-23 VITALS — BP 122/84 | HR 65 | Wt 263.0 lb

## 2016-11-23 DIAGNOSIS — E785 Hyperlipidemia, unspecified: Secondary | ICD-10-CM | POA: Diagnosis not present

## 2016-11-23 DIAGNOSIS — I10 Essential (primary) hypertension: Secondary | ICD-10-CM

## 2016-11-23 DIAGNOSIS — F329 Major depressive disorder, single episode, unspecified: Secondary | ICD-10-CM

## 2016-11-23 DIAGNOSIS — F32A Depression, unspecified: Secondary | ICD-10-CM

## 2016-11-23 DIAGNOSIS — F419 Anxiety disorder, unspecified: Secondary | ICD-10-CM

## 2016-11-23 DIAGNOSIS — E119 Type 2 diabetes mellitus without complications: Secondary | ICD-10-CM | POA: Diagnosis not present

## 2016-11-23 LAB — BAYER DCA HB A1C WAIVED: HB A1C (BAYER DCA - WAIVED): 5.8 % (ref <7.0)

## 2016-11-23 MED ORDER — CLONAZEPAM 1 MG PO TABS: 1.0000 mg | ORAL_TABLET | Freq: Every day | ORAL | 4 refills | Status: DC | PRN

## 2016-11-23 NOTE — Assessment & Plan Note (Signed)
Diet-controlled doing really well with good weight loss. Patient will continue good lifestyle changes and observe A1c again in 3 months.

## 2016-11-23 NOTE — Progress Notes (Signed)
BP 122/84   Pulse 65   Wt 263 lb (119.3 kg)   SpO2 95%   BMI 37.74 kg/m    Subjective:    Patient ID: Aaron Robinson, male    DOB: [birth year], 46 y.o.   MRN: [id number]  HPI: Aaron Robinson is a 46 y.o. male  Chief Complaint  Patient presents with  . Follow-up  . Diabetes  . Hypertension  Patient all in all for diabetes doing very well has lost 23 pounds since last fall. Hemoglobin A1c was 6.9 months ago today is 5.8. This is all been through dietary control. Patient's biggest problem is ongoing anxiety is taking Cymbalta which helps for depression but really has marked problems with anxiety. His only taking about 3 clonazepam during this times concerned about the habit-forming nature of control drugs.  Relevant past medical, surgical, family and social history reviewed and updated as indicated. Interim medical history since our last visit reviewed. Allergies and medications reviewed and updated.  Review of Systems  Constitutional: Negative.   Respiratory: Negative.   Cardiovascular: Negative.     Per HPI unless specifically indicated above     Objective:    BP 122/84   Pulse 65   Wt 263 lb (119.3 kg)   SpO2 95%   BMI 37.74 kg/m   Wt Readings from Last 3 Encounters:  11/23/16 263 lb (119.3 kg)  08/11/16 281 lb (127.5 kg)  02/11/16 286 lb 8 oz (130 kg)    Physical Exam  Constitutional: He is oriented to person, place, and time. He appears well-developed and well-nourished.  HENT:  Head: Normocephalic and atraumatic.  Eyes: Conjunctivae and EOM are normal.  Neck: Normal range of motion.  Cardiovascular: Normal rate, regular rhythm and normal heart sounds.   Pulmonary/Chest: Effort normal and breath sounds normal.  Musculoskeletal: Normal range of motion.  Neurological: He is alert and oriented to person, place, and time.  Skin: No erythema.  Psychiatric: He has a normal mood and affect. His behavior is normal. Judgment and thought content normal.     Results for orders placed or performed in visit on 08/17/16  Hgb A1c w/o eAG  Result Value Ref Range   Hgb A1c MFr Bld 6.9 (H) 4.8 - 5.6 %      Assessment & Plan:   Problem List Items Addressed This Visit      Cardiovascular and Mediastinum   Hypertension - Primary    The current medical regimen is effective;  continue present plan and medications.       Relevant Orders   Bayer DCA Hb A1c Waived   Basic metabolic panel     Endocrine   Diabetes mellitus without complication ([place])    Diet-controlled doing really well with good weight loss. Patient will continue good lifestyle changes and observe A1c again in 3 months.      Relevant Orders   Bayer DCA Hb A1c Waived   Basic metabolic panel     Other   Anxiety    Discussed patient's chronic anxiety which is been throughout his adulthood. We'll go ahead and encourage patient to take clonazepam 1 mg half a tablet each morning especially Monday through Friday with a half tablet extra as needed. Will give patient's 30 tablets for 30 days with 4 refills. Recheck patient in 3 months.      Hyperlipidemia   Depression   Relevant Medications   clonazePAM (KLONOPIN) 1 MG tablet       Follow up  plan: Return in about 3 months (around 02/23/2017) for Physical Exam, Hemoglobin A1c.

## 2016-11-23 NOTE — Assessment & Plan Note (Signed)
The current medical regimen is effective;  continue present plan and medications.  

## 2016-11-23 NOTE — Assessment & Plan Note (Signed)
Discussed patient's chronic anxiety which is been throughout his adulthood. We'll go ahead and encourage patient to take clonazepam 1 mg half a tablet each morning especially Monday through Friday with a half tablet extra as needed. Will give patient's 30 tablets for 30 days with 4 refills. Recheck patient in 3 months.

## 2016-11-24 LAB — BASIC METABOLIC PANEL
BUN/Creatinine Ratio: 16 (ref 9–20)
BUN: 16 mg/dL (ref 6–24)
CO2: 26 mmol/L (ref 20–29)
Calcium: 9.8 mg/dL (ref 8.7–10.2)
Chloride: 98 mmol/L (ref 96–106)
Creatinine, Ser: 1 mg/dL (ref 0.76–1.27)
GFR calc Af Amer: 104 mL/min/{1.73_m2} (ref >59)
GFR calc non Af Amer: 90 mL/min/{1.73_m2} (ref >59)
Glucose: 111 mg/dL — ABNORMAL HIGH (ref 65–99)
Potassium: 4.9 mmol/L (ref 3.5–5.2)
Sodium: 140 mmol/L (ref 134–144)

## 2017-01-27 ENCOUNTER — Other Ambulatory Visit: Payer: Self-pay | Admitting: Family Medicine

## 2017-01-27 DIAGNOSIS — F329 Major depressive disorder, single episode, unspecified: Secondary | ICD-10-CM

## 2017-01-27 DIAGNOSIS — F32A Depression, unspecified: Secondary | ICD-10-CM

## 2017-01-27 MED ORDER — DULOXETINE HCL 60 MG PO CPEP: 60.0000 mg | ORAL_CAPSULE | Freq: Every day | ORAL | 0 refills | Status: DC

## 2017-01-27 NOTE — Telephone Encounter (Signed)
Attempted to reach pt to notify. Went to [facility], and it was full.

## 2017-01-27 NOTE — Telephone Encounter (Signed)
Please advise 

## 2017-01-27 NOTE — Telephone Encounter (Signed)
Patient has misplaced his Cymbalta 60mg  per patient pharmacy states if Dr Jeananne Rama will provide them with a new prescription they would fill it. He said he really needs this medication without it he gets really bad headaches  Please advise  [place] [phone]

## 2017-03-02 ENCOUNTER — Ambulatory Visit (INDEPENDENT_AMBULATORY_CARE_PROVIDER_SITE_OTHER): Payer: Managed Care, Other (non HMO) | Admitting: Family Medicine

## 2017-03-02 ENCOUNTER — Encounter: Payer: Self-pay | Admitting: Family Medicine

## 2017-03-02 VITALS — BP 131/89 | HR 74 | Ht 71.0 in | Wt 273.0 lb

## 2017-03-02 DIAGNOSIS — E785 Hyperlipidemia, unspecified: Secondary | ICD-10-CM | POA: Diagnosis not present

## 2017-03-02 DIAGNOSIS — F329 Major depressive disorder, single episode, unspecified: Secondary | ICD-10-CM

## 2017-03-02 DIAGNOSIS — I10 Essential (primary) hypertension: Secondary | ICD-10-CM | POA: Diagnosis not present

## 2017-03-02 DIAGNOSIS — F32A Depression, unspecified: Secondary | ICD-10-CM

## 2017-03-02 DIAGNOSIS — E119 Type 2 diabetes mellitus without complications: Secondary | ICD-10-CM

## 2017-03-02 DIAGNOSIS — F419 Anxiety disorder, unspecified: Secondary | ICD-10-CM | POA: Diagnosis not present

## 2017-03-02 DIAGNOSIS — K219 Gastro-esophageal reflux disease without esophagitis: Secondary | ICD-10-CM | POA: Diagnosis not present

## 2017-03-02 DIAGNOSIS — Z0000 Encounter for general adult medical examination without abnormal findings: Secondary | ICD-10-CM

## 2017-03-02 DIAGNOSIS — Z23 Encounter for immunization: Secondary | ICD-10-CM | POA: Diagnosis not present

## 2017-03-02 LAB — MICROSCOPIC EXAMINATION
Bacteria, UA: NONE SEEN
RBC, UA: NONE SEEN /hpf (ref 0–?)

## 2017-03-02 LAB — URINALYSIS, ROUTINE W REFLEX MICROSCOPIC
Bilirubin, UA: NEGATIVE
Glucose, UA: NEGATIVE
Ketones, UA: NEGATIVE
Leukocytes, UA: NEGATIVE
Nitrite, UA: NEGATIVE
Protein, UA: NEGATIVE
RBC, UA: NEGATIVE
Specific Gravity, UA: 1.015 (ref 1.005–1.030)
Urobilinogen, Ur: 1 mg/dL (ref 0.2–1.0)
pH, UA: 7 (ref 5.0–7.5)

## 2017-03-02 LAB — BAYER DCA HB A1C WAIVED: HB A1C (BAYER DCA - WAIVED): 5.7 % (ref <7.0)

## 2017-03-02 MED ORDER — DULOXETINE HCL 60 MG PO CPEP: 60.0000 mg | ORAL_CAPSULE | Freq: Every day | ORAL | 4 refills | Status: DC

## 2017-03-02 MED ORDER — ATENOLOL 100 MG PO TABS: 100.0000 mg | ORAL_TABLET | Freq: Every day | ORAL | 4 refills | Status: DC

## 2017-03-02 MED ORDER — ESOMEPRAZOLE MAGNESIUM 40 MG PO CPDR: 40.0000 mg | DELAYED_RELEASE_CAPSULE | Freq: Two times a day (BID) | ORAL | 4 refills | Status: DC

## 2017-03-02 MED ORDER — BENAZEPRIL HCL 10 MG PO TABS: 10.0000 mg | ORAL_TABLET | Freq: Every day | ORAL | 4 refills | Status: DC

## 2017-03-02 NOTE — Assessment & Plan Note (Signed)
The current medical regimen is effective;  continue present plan and medications.  

## 2017-03-02 NOTE — Assessment & Plan Note (Signed)
Ongoing chronic anxiety controlled with when necessary clonazepam 1 mg one half tablet when necessary uses generally 1/2 tablet 3 times a week.

## 2017-03-02 NOTE — Assessment & Plan Note (Signed)
Diet controled 

## 2017-03-02 NOTE — Progress Notes (Signed)
BP 131/89   Pulse 74   Ht 5\' 11"  (1.803 m)   Wt 273 lb (123.8 kg)   SpO2 97%   BMI 38.08 kg/m    Subjective:    Patient ID: Aaron Robinson, male    DOB: January 28, 1971, 46 y.o.   MRN: [id number]  HPI: Aaron Robinson is a 46 y.o. male  Annual exam  Recheck hypertension doing well blood pressure really aided by the when necessary clonazepam. Patient's anxiety is been well controlled just knowing that clonazepam is available takes a half a tablet probably 3 days a week. Still has some refills left of prescription given in July. Cymbalta also seems to be doing well and takes Nexium 40 mg 1 a day for reflux which is well controlled.  Relevant past medical, surgical, family and social history reviewed and updated as indicated. Interim medical history since our last visit reviewed. Allergies and medications reviewed and updated.  Review of Systems  Constitutional: Negative.   HENT: Negative.   Eyes: Negative.   Respiratory: Negative.   Cardiovascular: Negative.   Gastrointestinal: Negative.   Endocrine: Negative.   Genitourinary: Negative.   Musculoskeletal: Negative.   Skin: Negative.   Allergic/Immunologic: Negative.   Neurological: Negative.   Hematological: Negative.   Psychiatric/Behavioral: Negative.     Per HPI unless specifically indicated above     Objective:    BP 131/89   Pulse 74   Ht 5\' 11"  (1.803 m)   Wt 273 lb (123.8 kg)   SpO2 97%   BMI 38.08 kg/m   Wt Readings from Last 3 Encounters:  03/02/17 273 lb (123.8 kg)  11/23/16 263 lb (119.3 kg)  08/11/16 281 lb (127.5 kg)    Physical Exam  Constitutional: He is oriented to person, place, and time. He appears well-developed and well-nourished.  HENT:  Head: Normocephalic and atraumatic.  Right Ear: External ear normal.  Left Ear: External ear normal.  Eyes: Pupils are equal, round, and reactive to light. Conjunctivae and EOM are normal.  Neck: Normal range of motion. Neck supple.    Cardiovascular: Normal rate, regular rhythm, normal heart sounds and intact distal pulses.   Pulmonary/Chest: Effort normal and breath sounds normal.  Abdominal: Soft. Bowel sounds are normal. There is no splenomegaly or hepatomegaly.  Genitourinary: Rectum normal, prostate normal and penis normal.  Musculoskeletal: Normal range of motion.  Neurological: He is alert and oriented to person, place, and time. He has normal reflexes.  Skin: No rash noted. No erythema.  Psychiatric: He has a normal mood and affect. His behavior is normal. Judgment and thought content normal.    Results for orders placed or performed in visit on 11/23/16  Bayer DCA Hb A1c Waived  Result Value Ref Range   Bayer DCA Hb A1c Waived 5.8 <5.8 %  Basic metabolic panel  Result Value Ref Range   Glucose 111 (H) 65 - 99 mg/dL   BUN 16 6 - 24 mg/dL   Creatinine, Ser 1.00 0.76 - 1.27 mg/dL   GFR calc non Af Amer 90 >59 mL/min/1.73   GFR calc Af Amer 104 >59 mL/min/1.73   BUN/Creatinine Ratio 16 9 - 20   Sodium 140 134 - 144 mmol/L   Potassium 4.9 3.5 - 5.2 mmol/L   Chloride 98 96 - 106 mmol/L   CO2 26 20 - 29 mmol/L   Calcium 9.8 8.7 - 10.2 mg/dL      Assessment & Plan:   Problem List Items Addressed This  Visit      Cardiovascular and Mediastinum   Hypertension    The current medical regimen is effective;  continue present plan and medications.       Relevant Medications   atenolol (TENORMIN) 100 MG tablet   benazepril (LOTENSIN) 10 MG tablet   Other Relevant Orders   CBC with Differential/Platelet   Comprehensive metabolic panel   TSH   Homocysteine     Digestive   GERD (gastroesophageal reflux disease)   Relevant Medications   esomeprazole (NEXIUM) 40 MG capsule   Other Relevant Orders   TSH     Endocrine   Diabetes mellitus without complication ([facility]) - Primary    Diet controled      Relevant Medications   benazepril (LOTENSIN) 10 MG tablet   Other Relevant Orders   Urinalysis,  Routine w reflex microscopic   Bayer DCA Hb A1c Waived     Other   Anxiety    Ongoing chronic anxiety controlled with when necessary clonazepam 1 mg one half tablet when necessary uses generally 1/2 tablet 3 times a week.      Relevant Medications   DULoxetine (CYMBALTA) 60 MG capsule   Hyperlipidemia    The current medical regimen is effective;  continue present plan and medications.       Relevant Medications   atenolol (TENORMIN) 100 MG tablet   benazepril (LOTENSIN) 10 MG tablet   Other Relevant Orders   Lipid panel   Depression    The current medical regimen is effective;  continue present plan and medications.       Relevant Medications   DULoxetine (CYMBALTA) 60 MG capsule   Other Relevant Orders   TSH    Other Visit Diagnoses    Needs flu shot       Relevant Orders   Flu Vaccine QUAD 6+ mos PF IM (Fluarix Quad PF)   PE (physical exam), annual       Relevant Orders   CBC with Differential/Platelet   Comprehensive metabolic panel   Lipid panel   PSA   TSH   Urinalysis, Routine w reflex microscopic       Follow up plan: Return in about 6 months (around 08/31/2017) for Hemoglobin A1c, BMP,  Lipids, ALT, AST.

## 2017-03-03 ENCOUNTER — Encounter: Payer: Self-pay | Admitting: Family Medicine

## 2017-03-03 LAB — LIPID PANEL
Chol/HDL Ratio: 3.9 ratio (ref 0.0–5.0)
Cholesterol, Total: 157 mg/dL (ref 100–199)
HDL: 40 mg/dL (ref >39)
LDL Calculated: 92 mg/dL (ref 0–99)
Triglycerides: 125 mg/dL (ref 0–149)
VLDL Cholesterol Cal: 25 mg/dL (ref 5–40)

## 2017-03-03 LAB — CBC WITH DIFFERENTIAL/PLATELET
Basophils Absolute: 0 10*3/uL (ref 0.0–0.2)
Basos: 0 %
EOS (ABSOLUTE): 0.3 10*3/uL (ref 0.0–0.4)
Eos: 5 %
Hematocrit: 44.6 % (ref 37.5–51.0)
Hemoglobin: 14.6 g/dL (ref 13.0–17.7)
Immature Grans (Abs): 0 10*3/uL (ref 0.0–0.1)
Immature Granulocytes: 0 %
Lymphocytes Absolute: 1.6 10*3/uL (ref 0.7–3.1)
Lymphs: 24 %
MCH: 27.1 pg (ref 26.6–33.0)
MCHC: 32.7 g/dL (ref 31.5–35.7)
MCV: 83 fL (ref 79–97)
Monocytes Absolute: 0.5 10*3/uL (ref 0.1–0.9)
Monocytes: 7 %
Neutrophils Absolute: 4.2 10*3/uL (ref 1.4–7.0)
Neutrophils: 64 %
Platelets: 257 10*3/uL (ref 150–379)
RBC: 5.38 x10E6/uL (ref 4.14–5.80)
RDW: 14.4 % (ref 12.3–15.4)
WBC: 6.7 10*3/uL (ref 3.4–10.8)

## 2017-03-03 LAB — COMPREHENSIVE METABOLIC PANEL
ALT: 27 IU/L (ref 0–44)
AST: 18 IU/L (ref 0–40)
Albumin/Globulin Ratio: 1.6 (ref 1.2–2.2)
Albumin: 4.5 g/dL (ref 3.5–5.5)
Alkaline Phosphatase: 64 IU/L (ref 39–117)
BUN/Creatinine Ratio: 11 (ref 9–20)
BUN: 10 mg/dL (ref 6–24)
Bilirubin Total: 0.4 mg/dL (ref 0.0–1.2)
CO2: 26 mmol/L (ref 20–29)
Calcium: 9.7 mg/dL (ref 8.7–10.2)
Chloride: 99 mmol/L (ref 96–106)
Creatinine, Ser: 0.91 mg/dL (ref 0.76–1.27)
GFR calc Af Amer: 116 mL/min/{1.73_m2} (ref >59)
GFR calc non Af Amer: 101 mL/min/{1.73_m2} (ref >59)
Globulin, Total: 2.9 g/dL (ref 1.5–4.5)
Glucose: 109 mg/dL — ABNORMAL HIGH (ref 65–99)
Potassium: 4.8 mmol/L (ref 3.5–5.2)
Sodium: 140 mmol/L (ref 134–144)
Total Protein: 7.4 g/dL (ref 6.0–8.5)

## 2017-03-03 LAB — HOMOCYSTEINE: Homocysteine: 10.3 umol/L (ref 0.0–15.0)

## 2017-03-03 LAB — PSA: Prostate Specific Ag, Serum: 0.5 ng/mL (ref 0.0–4.0)

## 2017-03-03 LAB — TSH: TSH: 1.14 u[IU]/mL (ref 0.450–4.500)

## 2017-09-01 ENCOUNTER — Ambulatory Visit: Payer: Managed Care, Other (non HMO) | Admitting: Family Medicine

## 2017-09-02 ENCOUNTER — Encounter: Payer: Self-pay | Admitting: Family Medicine

## 2017-09-26 ENCOUNTER — Telehealth: Payer: Self-pay | Admitting: Family Medicine

## 2017-09-26 ENCOUNTER — Ambulatory Visit (INDEPENDENT_AMBULATORY_CARE_PROVIDER_SITE_OTHER): Payer: Managed Care, Other (non HMO) | Admitting: Family Medicine

## 2017-09-26 ENCOUNTER — Encounter: Payer: Self-pay | Admitting: Family Medicine

## 2017-09-26 VITALS — BP 131/87 | HR 68 | Ht 71.0 in | Wt 269.0 lb

## 2017-09-26 DIAGNOSIS — F32A Depression, unspecified: Secondary | ICD-10-CM

## 2017-09-26 DIAGNOSIS — I10 Essential (primary) hypertension: Secondary | ICD-10-CM | POA: Diagnosis not present

## 2017-09-26 DIAGNOSIS — F419 Anxiety disorder, unspecified: Secondary | ICD-10-CM

## 2017-09-26 DIAGNOSIS — Z114 Encounter for screening for human immunodeficiency virus [HIV]: Secondary | ICD-10-CM | POA: Diagnosis not present

## 2017-09-26 DIAGNOSIS — R5383 Other fatigue: Secondary | ICD-10-CM | POA: Insufficient documentation

## 2017-09-26 DIAGNOSIS — E782 Mixed hyperlipidemia: Secondary | ICD-10-CM

## 2017-09-26 DIAGNOSIS — F329 Major depressive disorder, single episode, unspecified: Secondary | ICD-10-CM

## 2017-09-26 DIAGNOSIS — E119 Type 2 diabetes mellitus without complications: Secondary | ICD-10-CM | POA: Diagnosis not present

## 2017-09-26 LAB — BAYER DCA HB A1C WAIVED: HB A1C (BAYER DCA - WAIVED): 5.8 % (ref <7.0)

## 2017-09-26 LAB — LP+ALT+AST PICCOLO, WAIVED
ALT (SGPT) Piccolo, Waived: 40 U/L (ref 10–47)
AST (SGOT) Piccolo, Waived: 26 U/L (ref 11–38)
Chol/HDL Ratio Piccolo,Waive: 3.4 mg/dL
Cholesterol Piccolo, Waived: 143 mg/dL (ref <200)
HDL Chol Piccolo, Waived: 41 mg/dL — ABNORMAL LOW (ref >59)
LDL Chol Calc Piccolo Waived: 81 mg/dL (ref <100)
Triglycerides Piccolo,Waived: 101 mg/dL (ref <150)
VLDL Chol Calc Piccolo,Waive: 20 mg/dL (ref <30)

## 2017-09-26 MED ORDER — DULOXETINE HCL 30 MG PO CPEP
30.0000 mg | ORAL_CAPSULE | Freq: Every day | ORAL | 0 refills | Status: DC
Start: 2017-09-26 — End: 2017-12-08

## 2017-09-26 MED ORDER — DULOXETINE HCL 30 MG PO CPEP: 30.0000 mg | ORAL_CAPSULE | Freq: Every day | ORAL | 0 refills | Status: DC

## 2017-09-26 MED ORDER — CITALOPRAM HYDROBROMIDE 20 MG PO TABS: 20.0000 mg | ORAL_TABLET | Freq: Every day | ORAL | 3 refills | Status: DC

## 2017-09-26 MED ORDER — CLONAZEPAM 1 MG PO TABS: 1.0000 mg | ORAL_TABLET | Freq: Every day | ORAL | 4 refills | Status: DC | PRN

## 2017-09-26 NOTE — Assessment & Plan Note (Signed)
The current medical regimen is effective;  continue present plan and medications.  

## 2017-09-26 NOTE — Assessment & Plan Note (Signed)
Will taper Cymbalta to 30 mg for 6 days then discontinue start citalopram 20 mg 1 a day

## 2017-09-26 NOTE — Progress Notes (Signed)
BP 131/87   Pulse 68   Ht 5\' 11"  (1.803 m)   Wt 269 lb (122 kg)   SpO2 97%   BMI 37.52 kg/m    Subjective:    Patient ID: Aaron Robinson, male    DOB: Aug 11, 1970, 47 y.o.   MRN: [id number]  HPI: Aaron Robinson is a 47 y.o. male  Chief Complaint  Patient presents with  . Follow-up  . Hypertension  Patient especially concerned with daytime drowsiness sleepiness easily falling asleep during daytime conditions and observed sleep apnea.  Brother recently diagnosed with sleep apnea and doing really well with CPAP. Epworth score of 14. Will set up for sleep study Blood pressure doing well with medications.  Nerves stable on the Loxitane Reflux also stable. Depression is much improved but continued with anxiety wants to may be switch Cymbalta to something else that may help better with anxiety using clonazepam on a rare basis for more stormy days.  Relevant past medical, surgical, family and social history reviewed and updated as indicated. Interim medical history since our last visit reviewed. Allergies and medications reviewed and updated.  Review of Systems  Per HPI unless specifically indicated above     Objective:    BP 131/87   Pulse 68   Ht 5\' 11"  (1.803 m)   Wt 269 lb (122 kg)   SpO2 97%   BMI 37.52 kg/m   Wt Readings from Last 3 Encounters:  09/26/17 269 lb (122 kg)  03/02/17 273 lb (123.8 kg)  11/23/16 263 lb (119.3 kg)    Physical Exam  Results for orders placed or performed in visit on 03/02/17  Microscopic Examination  Result Value Ref Range   WBC, UA 0-5 0 - 5 /hpf   RBC, UA None seen 0 - 2 /hpf   Epithelial Cells (non renal) CANCELED    Bacteria, UA None seen None seen/Few  CBC with Differential/Platelet  Result Value Ref Range   WBC 6.7 3.4 - 10.8 x10E3/uL   RBC 5.38 4.14 - 5.80 x10E6/uL   Hemoglobin 14.6 13.0 - 17.7 g/dL   Hematocrit 44.6 37.5 - 51.0 %   MCV 83 79 - 97 fL   MCH 27.1 26.6 - 33.0 pg   MCHC 32.7 31.5 - 35.7 g/dL   RDW  14.4 12.3 - 15.4 %   Platelets 257 150 - 379 x10E3/uL   Neutrophils 64 Not Estab. %   Lymphs 24 Not Estab. %   Monocytes 7 Not Estab. %   Eos 5 Not Estab. %   Basos 0 Not Estab. %   Neutrophils Absolute 4.2 1.4 - 7.0 x10E3/uL   Lymphocytes Absolute 1.6 0.7 - 3.1 x10E3/uL   Monocytes Absolute 0.5 0.1 - 0.9 x10E3/uL   EOS (ABSOLUTE) 0.3 0.0 - 0.4 x10E3/uL   Basophils Absolute 0.0 0.0 - 0.2 x10E3/uL   Immature Granulocytes 0 Not Estab. %   Immature Grans (Abs) 0.0 0.0 - 0.1 x10E3/uL  Comprehensive metabolic panel  Result Value Ref Range   Glucose 109 (H) 65 - 99 mg/dL   BUN 10 6 - 24 mg/dL   Creatinine, Ser 0.91 0.76 - 1.27 mg/dL   GFR calc non Af Amer 101 >59 mL/min/1.73   GFR calc Af Amer 116 >59 mL/min/1.73   BUN/Creatinine Ratio 11 9 - 20   Sodium 140 134 - 144 mmol/L   Potassium 4.8 3.5 - 5.2 mmol/L   Chloride 99 96 - 106 mmol/L   CO2 26 20 - 29  mmol/L   Calcium 9.7 8.7 - 10.2 mg/dL   Total Protein 7.4 6.0 - 8.5 g/dL   Albumin 4.5 3.5 - 5.5 g/dL   Globulin, Total 2.9 1.5 - 4.5 g/dL   Albumin/Globulin Ratio 1.6 1.2 - 2.2   Bilirubin Total 0.4 0.0 - 1.2 mg/dL   Alkaline Phosphatase 64 39 - 117 IU/L   AST 18 0 - 40 IU/L   ALT 27 0 - 44 IU/L  Lipid panel  Result Value Ref Range   Cholesterol, Total 157 100 - 199 mg/dL   Triglycerides 125 0 - 149 mg/dL   HDL 40 >39 mg/dL   VLDL Cholesterol Cal 25 5 - 40 mg/dL   LDL Calculated 92 0 - 99 mg/dL   Chol/HDL Ratio 3.9 0.0 - 5.0 ratio  PSA  Result Value Ref Range   Prostate Specific Ag, Serum 0.5 0.0 - 4.0 ng/mL  TSH  Result Value Ref Range   TSH 1.140 0.450 - 4.500 uIU/mL  Urinalysis, Routine w reflex microscopic  Result Value Ref Range   Specific Gravity, UA 1.015 1.005 - 1.030   pH, UA 7.0 5.0 - 7.5   Color, UA Yellow Yellow   Appearance Ur Clear Clear   Leukocytes, UA Negative Negative   Protein, UA Negative Negative/Trace   Glucose, UA Negative Negative   Ketones, UA Negative Negative   RBC, UA Negative Negative     Bilirubin, UA Negative Negative   Urobilinogen, Ur 1.0 0.2 - 1.0 mg/dL   Nitrite, UA Negative Negative   Microscopic Examination See below:   Bayer DCA Hb A1c Waived  Result Value Ref Range   Bayer DCA Hb A1c Waived 5.7 <7.0 %  Homocysteine  Result Value Ref Range   Homocysteine 10.3 0.0 - 15.0 umol/L      Assessment & Plan:   Problem List Items Addressed This Visit      Cardiovascular and Mediastinum   Hypertension - Primary    The current medical regimen is effective;  continue present plan and medications.       Relevant Orders   Basic metabolic panel   Bayer DCA Hb A1c Waived   LP+ALT+AST Piccolo, Waived     Endocrine   Diabetes mellitus without complication ([place])    Diet controlled and doing well      Relevant Orders   Basic metabolic panel   Bayer DCA Hb A1c Waived   LP+ALT+AST Piccolo, Waived     Other   Anxiety    Continue as needed clonazepam      Relevant Medications   DULoxetine (CYMBALTA) 30 MG capsule   citalopram (CELEXA) 20 MG tablet   Hyperlipidemia    The current medical regimen is effective;  continue present plan and medications.       Relevant Orders   Basic metabolic panel   Bayer DCA Hb A1c Waived   LP+ALT+AST Piccolo, Waived   Depression    Will taper Cymbalta to 30 mg for 6 days then discontinue start citalopram 20 mg 1 a day      Relevant Medications   clonazePAM (KLONOPIN) 1 MG tablet   DULoxetine (CYMBALTA) 30 MG capsule   citalopram (CELEXA) 20 MG tablet   Fatigue    Observed apnea we will set up for sleep study      Relevant Orders   Ambulatory referral to Sleep Studies    Other Visit Diagnoses    Encounter for screening for HIV       Relevant Orders  HIV antibody (with reflex)       Follow up plan: Return in about 2 months (around 11/26/2017).

## 2017-09-26 NOTE — Assessment & Plan Note (Signed)
Observed apnea we will set up for sleep study

## 2017-09-26 NOTE — Assessment & Plan Note (Signed)
Diet controlled and doing well

## 2017-09-26 NOTE — Assessment & Plan Note (Signed)
Continue as needed clonazepam 

## 2017-09-26 NOTE — Telephone Encounter (Signed)
Copied from [place] [id number]. Topic: Quick Communication - Rx Refill/Question >> Sep 26, 2017 12:08 PM Marin Olp L wrote: Medication: DULoxetine (CYMBALTA) 30 MG capsule Has the patient contacted their pharmacy? Yes.   (Agent: If no, request that the patient contact the pharmacy for the refill.) Preferred Pharmacy (with phone number or street name): [facility] [place] [address] [place] [address] Phone: [phone] Fax: [phone] Agent: Please be advised that RX refills may take up to 3 business days. We ask that you follow-up with your pharmacy.  Clarify quantity. Only six pills sent.

## 2017-09-27 ENCOUNTER — Encounter: Payer: Self-pay | Admitting: Family Medicine

## 2017-09-27 LAB — BASIC METABOLIC PANEL
BUN/Creatinine Ratio: 10 (ref 9–20)
BUN: 9 mg/dL (ref 6–24)
CO2: 23 mmol/L (ref 20–29)
Calcium: 9.2 mg/dL (ref 8.7–10.2)
Chloride: 101 mmol/L (ref 96–106)
Creatinine, Ser: 0.92 mg/dL (ref 0.76–1.27)
GFR calc Af Amer: 114 mL/min/{1.73_m2} (ref >59)
GFR calc non Af Amer: 99 mL/min/{1.73_m2} (ref >59)
Glucose: 95 mg/dL (ref 65–99)
Potassium: 4.2 mmol/L (ref 3.5–5.2)
Sodium: 138 mmol/L (ref 134–144)

## 2017-09-27 LAB — HIV ANTIBODY (ROUTINE TESTING W REFLEX): HIV Screen 4th Generation wRfx: NONREACTIVE

## 2017-10-18 ENCOUNTER — Encounter: Payer: Self-pay | Admitting: Family Medicine

## 2017-11-09 ENCOUNTER — Ambulatory Visit: Payer: Managed Care, Other (non HMO) | Attending: Neurology

## 2017-11-09 DIAGNOSIS — G4733 Obstructive sleep apnea (adult) (pediatric): Secondary | ICD-10-CM | POA: Insufficient documentation

## 2017-11-09 DIAGNOSIS — I10 Essential (primary) hypertension: Secondary | ICD-10-CM | POA: Insufficient documentation

## 2017-11-09 DIAGNOSIS — R0683 Snoring: Secondary | ICD-10-CM | POA: Diagnosis not present

## 2017-11-09 DIAGNOSIS — F5101 Primary insomnia: Secondary | ICD-10-CM | POA: Insufficient documentation

## 2017-11-09 DIAGNOSIS — F329 Major depressive disorder, single episode, unspecified: Secondary | ICD-10-CM | POA: Insufficient documentation

## 2017-11-23 ENCOUNTER — Telehealth: Payer: Self-pay | Admitting: Family Medicine

## 2017-11-23 NOTE — Telephone Encounter (Signed)
Copied from [place] [id number]. Topic: Quick Communication - Other Results >> Nov 22, 2017  4:05 PM Antonieta Iba C wrote: Pt is calling in for his sleep study results.   CB: [phone] Phone call Discussed with patient sleep apnea CPAP results patient started on CPAP as soon as he gets machine.

## 2017-11-30 ENCOUNTER — Other Ambulatory Visit: Payer: Self-pay | Admitting: Family Medicine

## 2017-11-30 DIAGNOSIS — F329 Major depressive disorder, single episode, unspecified: Secondary | ICD-10-CM

## 2017-11-30 DIAGNOSIS — F32A Depression, unspecified: Secondary | ICD-10-CM

## 2017-12-08 ENCOUNTER — Encounter: Payer: Self-pay | Admitting: Family Medicine

## 2017-12-08 ENCOUNTER — Ambulatory Visit (INDEPENDENT_AMBULATORY_CARE_PROVIDER_SITE_OTHER): Payer: Managed Care, Other (non HMO) | Admitting: Family Medicine

## 2017-12-08 DIAGNOSIS — Z9989 Dependence on other enabling machines and devices: Secondary | ICD-10-CM

## 2017-12-08 DIAGNOSIS — I10 Essential (primary) hypertension: Secondary | ICD-10-CM

## 2017-12-08 DIAGNOSIS — K219 Gastro-esophageal reflux disease without esophagitis: Secondary | ICD-10-CM | POA: Diagnosis not present

## 2017-12-08 DIAGNOSIS — F419 Anxiety disorder, unspecified: Secondary | ICD-10-CM | POA: Diagnosis not present

## 2017-12-08 DIAGNOSIS — G4733 Obstructive sleep apnea (adult) (pediatric): Secondary | ICD-10-CM

## 2017-12-08 DIAGNOSIS — E785 Hyperlipidemia, unspecified: Secondary | ICD-10-CM

## 2017-12-08 MED ORDER — ESOMEPRAZOLE MAGNESIUM 40 MG PO CPDR: 40.0000 mg | DELAYED_RELEASE_CAPSULE | Freq: Two times a day (BID) | ORAL | 4 refills | Status: DC

## 2017-12-08 MED ORDER — BENAZEPRIL HCL 10 MG PO TABS: 10.0000 mg | ORAL_TABLET | Freq: Every day | ORAL | 4 refills | Status: DC

## 2017-12-08 MED ORDER — ATENOLOL 100 MG PO TABS: 100.0000 mg | ORAL_TABLET | Freq: Every day | ORAL | 4 refills | Status: DC

## 2017-12-08 NOTE — Assessment & Plan Note (Signed)
The current medical regimen is effective;  continue present plan and medications.  

## 2017-12-08 NOTE — Assessment & Plan Note (Signed)
Has new CPAP machine and after only 2 nights is noticed an improvement

## 2017-12-08 NOTE — Progress Notes (Signed)
BP 136/80 (BP Location: Left Arm)   Pulse 88   Ht 5\' 11"  (1.803 m)   Wt 282 lb (127.9 kg)   SpO2 97%   BMI 39.33 kg/m    Subjective:    Patient ID: Aaron Robinson, male    DOB: [birth year], 47 y.o.   MRN: [id number]  HPI: Aaron Robinson is a 47 y.o. male  Chief Complaint  Patient presents with  . Follow-up  Patient follow-up noted citalopram is been able to stop Cymbalta and doing okay may be a little more moody but otherwise doing okay Other medication doing well blood pressure no complaints control Patient diagnosed with sleep apnea and has CPAP machine used it 2 nights and already notices a difference. Discussed use of clonazepam patient using very sparingly understands this is not an everyday medication  Relevant past medical, surgical, family and social history reviewed and updated as indicated. Interim medical history since our last visit reviewed. Allergies and medications reviewed and updated.  Review of Systems  Per HPI unless specifically indicated above     Objective:    BP 136/80 (BP Location: Left Arm)   Pulse 88   Ht 5\' 11"  (1.803 m)   Wt 282 lb (127.9 kg)   SpO2 97%   BMI 39.33 kg/m   Wt Readings from Last 3 Encounters:  12/08/17 282 lb (127.9 kg)  09/26/17 269 lb (122 kg)  03/02/17 273 lb (123.8 kg)    Physical Exam  Results for orders placed or performed in visit on 78/29/56  Basic metabolic panel  Result Value Ref Range   Glucose 95 65 - 99 mg/dL   BUN 9 6 - 24 mg/dL   Creatinine, Ser 0.92 0.76 - 1.27 mg/dL   GFR calc non Af Amer 99 >59 mL/min/1.73   GFR calc Af Amer 114 >59 mL/min/1.73   BUN/Creatinine Ratio 10 9 - 20   Sodium 138 134 - 144 mmol/L   Potassium 4.2 3.5 - 5.2 mmol/L   Chloride 101 96 - 106 mmol/L   CO2 23 20 - 29 mmol/L   Calcium 9.2 8.7 - 10.2 mg/dL  Bayer DCA Hb A1c Waived  Result Value Ref Range   HB A1C (BAYER DCA - WAIVED) 5.8 <7.0 %  LP+ALT+AST Piccolo, Waived  Result Value Ref Range   ALT (SGPT)  Piccolo, Waived 40 10 - 47 U/L   AST (SGOT) Piccolo, Waived 26 11 - 38 U/L   Cholesterol Piccolo, Waived 143 <200 mg/dL   HDL Chol Piccolo, Waived 41 (L) >59 mg/dL   Triglycerides Piccolo,Waived 101 <150 mg/dL   Chol/HDL Ratio Piccolo,Waive 3.4 mg/dL   LDL Chol Calc Piccolo Waived 81 <100 mg/dL   VLDL Chol Calc Piccolo,Waive 20 <30 mg/dL  HIV antibody (with reflex)  Result Value Ref Range   HIV Screen 4th Generation wRfx Non Reactive Non Reactive      Assessment & Plan:   Problem List Items Addressed This Visit      Cardiovascular and Mediastinum   Hypertension    The current medical regimen is effective;  continue present plan and medications.       Relevant Medications   atenolol (TENORMIN) 100 MG tablet   benazepril (LOTENSIN) 10 MG tablet     Respiratory   OSA on CPAP    Has new CPAP machine and after only 2 nights is noticed an improvement        Digestive   GERD (gastroesophageal reflux disease)   Relevant  Medications   esomeprazole (NEXIUM) 40 MG capsule     Other   Anxiety    The current medical regimen is effective;  continue present plan and medications.       Hyperlipidemia    The current medical regimen is effective;  continue present plan and medications.       Relevant Medications   atenolol (TENORMIN) 100 MG tablet   benazepril (LOTENSIN) 10 MG tablet       Follow up plan: Return in about 6 months (around 06/10/2018) for BMP,  Lipids, ALT, AST, Hemoglobin A1c.

## 2018-02-03 ENCOUNTER — Encounter: Payer: Self-pay | Admitting: Family Medicine

## 2018-02-09 ENCOUNTER — Ambulatory Visit: Payer: Self-pay | Admitting: Family Medicine

## 2018-02-10 ENCOUNTER — Ambulatory Visit: Payer: Self-pay | Admitting: Family Medicine

## 2018-03-07 ENCOUNTER — Other Ambulatory Visit: Payer: Self-pay | Admitting: Family Medicine

## 2018-03-07 ENCOUNTER — Encounter: Payer: Self-pay | Admitting: Family Medicine

## 2018-03-07 MED ORDER — CITALOPRAM HYDROBROMIDE 20 MG PO TABS: 20.0000 mg | ORAL_TABLET | Freq: Every day | ORAL | 3 refills | Status: DC

## 2018-03-07 NOTE — Progress Notes (Signed)
citalo 

## 2018-05-04 ENCOUNTER — Encounter: Payer: Self-pay | Admitting: Family Medicine

## 2018-05-08 ENCOUNTER — Ambulatory Visit (INDEPENDENT_AMBULATORY_CARE_PROVIDER_SITE_OTHER): Payer: 59 | Admitting: Family Medicine

## 2018-05-08 ENCOUNTER — Encounter: Payer: Self-pay | Admitting: Family Medicine

## 2018-05-08 VITALS — BP 132/88 | HR 65 | Temp 98.5°F | Ht 70.67 in | Wt 278.0 lb

## 2018-05-08 DIAGNOSIS — E785 Hyperlipidemia, unspecified: Secondary | ICD-10-CM | POA: Diagnosis not present

## 2018-05-08 DIAGNOSIS — I10 Essential (primary) hypertension: Secondary | ICD-10-CM | POA: Diagnosis not present

## 2018-05-08 DIAGNOSIS — E119 Type 2 diabetes mellitus without complications: Secondary | ICD-10-CM

## 2018-05-08 DIAGNOSIS — F419 Anxiety disorder, unspecified: Secondary | ICD-10-CM

## 2018-05-08 DIAGNOSIS — Z23 Encounter for immunization: Secondary | ICD-10-CM

## 2018-05-08 DIAGNOSIS — K219 Gastro-esophageal reflux disease without esophagitis: Secondary | ICD-10-CM | POA: Diagnosis not present

## 2018-05-08 DIAGNOSIS — Z9989 Dependence on other enabling machines and devices: Secondary | ICD-10-CM

## 2018-05-08 DIAGNOSIS — F32A Depression, unspecified: Secondary | ICD-10-CM

## 2018-05-08 DIAGNOSIS — G4733 Obstructive sleep apnea (adult) (pediatric): Secondary | ICD-10-CM

## 2018-05-08 DIAGNOSIS — F329 Major depressive disorder, single episode, unspecified: Secondary | ICD-10-CM

## 2018-05-08 DIAGNOSIS — Z0000 Encounter for general adult medical examination without abnormal findings: Secondary | ICD-10-CM | POA: Diagnosis not present

## 2018-05-08 LAB — URINALYSIS, ROUTINE W REFLEX MICROSCOPIC
Bilirubin, UA: NEGATIVE
Glucose, UA: NEGATIVE
Ketones, UA: NEGATIVE
Leukocytes, UA: NEGATIVE
Nitrite, UA: NEGATIVE
Protein, UA: NEGATIVE
RBC, UA: NEGATIVE
Specific Gravity, UA: 1.02 (ref 1.005–1.030)
Urobilinogen, Ur: 0.2 mg/dL (ref 0.2–1.0)
pH, UA: 5.5 (ref 5.0–7.5)

## 2018-05-08 LAB — BAYER DCA HB A1C WAIVED: HB A1C (BAYER DCA - WAIVED): 6.1 % (ref <7.0)

## 2018-05-08 MED ORDER — CLONAZEPAM 1 MG PO TABS: 1.0000 mg | ORAL_TABLET | Freq: Every day | ORAL | 4 refills | Status: DC | PRN

## 2018-05-08 MED ORDER — ESOMEPRAZOLE MAGNESIUM 40 MG PO CPDR: 40.0000 mg | DELAYED_RELEASE_CAPSULE | Freq: Two times a day (BID) | ORAL | 4 refills | Status: DC

## 2018-05-08 MED ORDER — BENAZEPRIL HCL 10 MG PO TABS: 10.0000 mg | ORAL_TABLET | Freq: Every day | ORAL | 4 refills | Status: DC

## 2018-05-08 MED ORDER — ATENOLOL 100 MG PO TABS: 100.0000 mg | ORAL_TABLET | Freq: Every day | ORAL | 4 refills | Status: DC

## 2018-05-08 MED ORDER — CITALOPRAM HYDROBROMIDE 40 MG PO TABS: 40.0000 mg | ORAL_TABLET | Freq: Every day | ORAL | 4 refills | Status: DC

## 2018-05-08 NOTE — Assessment & Plan Note (Signed)
c 

## 2018-05-08 NOTE — Assessment & Plan Note (Signed)
Will increase citalopram to 40 mg daily.

## 2018-05-08 NOTE — Assessment & Plan Note (Signed)
The current medical regimen is effective;  continue present plan and medications.  

## 2018-05-08 NOTE — Progress Notes (Signed)
BP 132/88   Pulse 65   Temp 98.5 F (36.9 C) (Oral)   Ht 5' 10.67" (1.795 m)   Wt 278 lb (126.1 kg)   SpO2 95%   BMI 39.14 kg/m    Subjective:    Patient ID: Aaron Robinson, male    DOB: August 03, 1970, 47 y.o.   MRN: [id number]  HPI: Aaron Robinson is a 47 y.o. male  Chief Complaint  Patient presents with  . Follow-up  . Annual Exam  Patient all in all doing well but OCD depression maybe needs a little boost concerned the 20 mg may not be enough will go ahead and increase to 40 mg a day. CPAP struggle some while trying to fall asleep but when wears it at night does really well and feels better the next day. Patient using clonazepam maybe 5 a month.  Uses for especially bad days. Blood pressure doing well with Benzapril atenolol. Doing well with reflux with Nexium.  Relevant past medical, surgical, family and social history reviewed and updated as indicated. Interim medical history since our last visit reviewed. Allergies and medications reviewed and updated.  Review of Systems  Constitutional: Negative.   HENT: Negative.   Eyes: Negative.   Respiratory: Negative.   Cardiovascular: Negative.   Gastrointestinal: Negative.   Endocrine: Negative.   Genitourinary: Negative.   Musculoskeletal: Negative.   Skin: Negative.   Allergic/Immunologic: Negative.   Neurological: Negative.   Hematological: Negative.   Psychiatric/Behavioral: Negative.     Per HPI unless specifically indicated above     Objective:    BP 132/88   Pulse 65   Temp 98.5 F (36.9 C) (Oral)   Ht 5' 10.67" (1.795 m)   Wt 278 lb (126.1 kg)   SpO2 95%   BMI 39.14 kg/m   Wt Readings from Last 3 Encounters:  05/08/18 278 lb (126.1 kg)  12/08/17 282 lb (127.9 kg)  09/26/17 269 lb (122 kg)    Physical Exam Constitutional:      Appearance: He is well-developed.  HENT:     Head: Normocephalic and atraumatic.     Right Ear: External ear normal.     Left Ear: External ear normal.  Eyes:       Conjunctiva/sclera: Conjunctivae normal.     Pupils: Pupils are equal, round, and reactive to light.  Neck:     Musculoskeletal: Normal range of motion and neck supple.  Cardiovascular:     Rate and Rhythm: Normal rate and regular rhythm.     Heart sounds: Normal heart sounds.  Pulmonary:     Effort: Pulmonary effort is normal.     Breath sounds: Normal breath sounds.  Abdominal:     General: Bowel sounds are normal.     Palpations: Abdomen is soft. There is no hepatomegaly or splenomegaly.  Genitourinary:    Penis: Normal.      Prostate: Normal.     Rectum: Normal.  Musculoskeletal: Normal range of motion.  Skin:    Findings: No erythema or rash.  Neurological:     Mental Status: He is alert and oriented to person, place, and time.     Deep Tendon Reflexes: Reflexes are normal and symmetric.  Psychiatric:        Behavior: Behavior normal.        Thought Content: Thought content normal.        Judgment: Judgment normal.     Results for orders placed or performed in visit on 09/26/17  Basic metabolic panel  Result Value Ref Range   Glucose 95 65 - 99 mg/dL   BUN 9 6 - 24 mg/dL   Creatinine, Ser 0.92 0.76 - 1.27 mg/dL   GFR calc non Af Amer 99 >59 mL/min/1.73   GFR calc Af Amer 114 >59 mL/min/1.73   BUN/Creatinine Ratio 10 9 - 20   Sodium 138 134 - 144 mmol/L   Potassium 4.2 3.5 - 5.2 mmol/L   Chloride 101 96 - 106 mmol/L   CO2 23 20 - 29 mmol/L   Calcium 9.2 8.7 - 10.2 mg/dL  Bayer DCA Hb A1c Waived  Result Value Ref Range   HB A1C (BAYER DCA - WAIVED) 5.8 <7.0 %  LP+ALT+AST Piccolo, Waived  Result Value Ref Range   ALT (SGPT) Piccolo, Waived 40 10 - 47 U/L   AST (SGOT) Piccolo, Waived 26 11 - 38 U/L   Cholesterol Piccolo, Waived 143 <200 mg/dL   HDL Chol Piccolo, Waived 41 (L) >59 mg/dL   Triglycerides Piccolo,Waived 101 <150 mg/dL   Chol/HDL Ratio Piccolo,Waive 3.4 mg/dL   LDL Chol Calc Piccolo Waived 81 <100 mg/dL   VLDL Chol Calc Piccolo,Waive 20 <30  mg/dL  HIV antibody (with reflex)  Result Value Ref Range   HIV Screen 4th Generation wRfx Non Reactive Non Reactive      Assessment & Plan:   Problem List Items Addressed This Visit      Cardiovascular and Mediastinum   Hypertension - Primary    .c       Relevant Medications   benazepril (LOTENSIN) 10 MG tablet   atenolol (TENORMIN) 100 MG tablet   Other Relevant Orders   Bayer DCA Hb A1c Waived   CBC with Differential/Platelet   Comprehensive metabolic panel   Lipid panel   PSA   TSH   Urinalysis, Routine w reflex microscopic     Respiratory   OSA on CPAP    Discussed CPAP usage and reviewed CPAP report with emphasis on turning blue every night.        Digestive   GERD (gastroesophageal reflux disease)    The current medical regimen is effective;  continue present plan and medications.       Relevant Medications   esomeprazole (NEXIUM) 40 MG capsule   Other Relevant Orders   CBC with Differential/Platelet   Comprehensive metabolic panel   Lipid panel   PSA   TSH   Urinalysis, Routine w reflex microscopic     Endocrine   Diabetes mellitus without complication ([place])    The current medical regimen is effective;  continue present plan and medications.       Relevant Medications   benazepril (LOTENSIN) 10 MG tablet   Other Relevant Orders   Bayer DCA Hb A1c Waived   CBC with Differential/Platelet   Comprehensive metabolic panel   Lipid panel   PSA   TSH   Urinalysis, Routine w reflex microscopic     Other   Anxiety    Discussed anxiety and clonazepam using appropriately.      Relevant Medications   citalopram (CELEXA) 40 MG tablet   Hyperlipidemia    The current medical regimen is effective;  continue present plan and medications.       Relevant Medications   benazepril (LOTENSIN) 10 MG tablet   atenolol (TENORMIN) 100 MG tablet   Other Relevant Orders   Bayer DCA Hb A1c Waived   CBC with Differential/Platelet   Comprehensive metabolic  panel  Lipid panel   PSA   TSH   Urinalysis, Routine w reflex microscopic   Depression    Will increase citalopram to 40 mg daily.      Relevant Medications   citalopram (CELEXA) 40 MG tablet   clonazePAM (KLONOPIN) 1 MG tablet    Other Visit Diagnoses    Need for vaccination for H flu type B       Flu vaccine need       Relevant Orders   Flu Vaccine QUAD 6+ mos PF IM (Fluarix Quad PF) (Completed)   PE (physical exam), annual       Relevant Orders   Homocysteine       Follow up plan: Return in about 6 months (around 11/07/2018) for Hemoglobin A1c, BMP,  Lipids, ALT, AST.

## 2018-05-08 NOTE — Patient Instructions (Signed)

## 2018-05-08 NOTE — Assessment & Plan Note (Signed)
Discussed CPAP usage and reviewed CPAP report with emphasis on turning blue every night.

## 2018-05-08 NOTE — Assessment & Plan Note (Signed)
Discussed anxiety and clonazepam using appropriately.

## 2018-05-09 LAB — COMPREHENSIVE METABOLIC PANEL
ALT: 23 IU/L (ref 0–44)
AST: 19 IU/L (ref 0–40)
Albumin/Globulin Ratio: 1.3 (ref 1.2–2.2)
Albumin: 4.3 g/dL (ref 3.5–5.5)
Alkaline Phosphatase: 71 IU/L (ref 39–117)
BUN/Creatinine Ratio: 15 (ref 9–20)
BUN: 13 mg/dL (ref 6–24)
Bilirubin Total: 0.3 mg/dL (ref 0.0–1.2)
CO2: 22 mmol/L (ref 20–29)
Calcium: 9.4 mg/dL (ref 8.7–10.2)
Chloride: 98 mmol/L (ref 96–106)
Creatinine, Ser: 0.89 mg/dL (ref 0.76–1.27)
GFR calc Af Amer: 118 mL/min/{1.73_m2} (ref >59)
GFR calc non Af Amer: 102 mL/min/{1.73_m2} (ref >59)
Globulin, Total: 3.4 g/dL (ref 1.5–4.5)
Glucose: 93 mg/dL (ref 65–99)
Potassium: 4.1 mmol/L (ref 3.5–5.2)
Sodium: 135 mmol/L (ref 134–144)
Total Protein: 7.7 g/dL (ref 6.0–8.5)

## 2018-05-09 LAB — CBC WITH DIFFERENTIAL/PLATELET
Basophils Absolute: 0 10*3/uL (ref 0.0–0.2)
Basos: 1 %
EOS (ABSOLUTE): 0.2 10*3/uL (ref 0.0–0.4)
Eos: 3 %
Hematocrit: 43 % (ref 37.5–51.0)
Hemoglobin: 14.3 g/dL (ref 13.0–17.7)
Immature Grans (Abs): 0 10*3/uL (ref 0.0–0.1)
Immature Granulocytes: 0 %
Lymphocytes Absolute: 1.7 10*3/uL (ref 0.7–3.1)
Lymphs: 22 %
MCH: 26 pg — ABNORMAL LOW (ref 26.6–33.0)
MCHC: 33.3 g/dL (ref 31.5–35.7)
MCV: 78 fL — ABNORMAL LOW (ref 79–97)
Monocytes Absolute: 0.5 10*3/uL (ref 0.1–0.9)
Monocytes: 7 %
Neutrophils Absolute: 5.3 10*3/uL (ref 1.4–7.0)
Neutrophils: 67 %
Platelets: 293 10*3/uL (ref 150–450)
RBC: 5.51 x10E6/uL (ref 4.14–5.80)
RDW: 14.1 % (ref 12.3–15.4)
WBC: 7.9 10*3/uL (ref 3.4–10.8)

## 2018-05-09 LAB — TSH: TSH: 1.29 u[IU]/mL (ref 0.450–4.500)

## 2018-05-09 LAB — LIPID PANEL
Chol/HDL Ratio: 3.6 ratio (ref 0.0–5.0)
Cholesterol, Total: 153 mg/dL (ref 100–199)
HDL: 42 mg/dL (ref >39)
LDL Calculated: 90 mg/dL (ref 0–99)
Triglycerides: 105 mg/dL (ref 0–149)
VLDL Cholesterol Cal: 21 mg/dL (ref 5–40)

## 2018-05-09 LAB — HOMOCYSTEINE: Homocysteine: 10.5 umol/L (ref 0.0–15.0)

## 2018-05-09 LAB — PSA: Prostate Specific Ag, Serum: 0.5 ng/mL (ref 0.0–4.0)

## 2018-05-10 ENCOUNTER — Encounter: Payer: Self-pay | Admitting: Family Medicine

## 2018-11-08 ENCOUNTER — Ambulatory Visit: Payer: 59 | Admitting: Family Medicine

## 2018-11-09 ENCOUNTER — Other Ambulatory Visit: Payer: Self-pay

## 2018-11-09 ENCOUNTER — Encounter: Payer: Self-pay | Admitting: Family Medicine

## 2018-11-09 ENCOUNTER — Ambulatory Visit (INDEPENDENT_AMBULATORY_CARE_PROVIDER_SITE_OTHER): Payer: 59 | Admitting: Family Medicine

## 2018-11-09 DIAGNOSIS — I10 Essential (primary) hypertension: Secondary | ICD-10-CM

## 2018-11-09 DIAGNOSIS — F32A Depression, unspecified: Secondary | ICD-10-CM

## 2018-11-09 DIAGNOSIS — F419 Anxiety disorder, unspecified: Secondary | ICD-10-CM

## 2018-11-09 DIAGNOSIS — E785 Hyperlipidemia, unspecified: Secondary | ICD-10-CM

## 2018-11-09 DIAGNOSIS — R7303 Prediabetes: Secondary | ICD-10-CM

## 2018-11-09 DIAGNOSIS — F329 Major depressive disorder, single episode, unspecified: Secondary | ICD-10-CM

## 2018-11-09 MED ORDER — CLONAZEPAM 1 MG PO TABS: 1.0000 mg | ORAL_TABLET | Freq: Every day | ORAL | 1 refills | Status: DC | PRN

## 2018-11-09 NOTE — Assessment & Plan Note (Signed)
The current medical regimen is effective;  continue present plan and medications.  

## 2018-11-09 NOTE — Assessment & Plan Note (Signed)
Diet controlled and stable checking hemoglobin A1c today

## 2018-11-09 NOTE — Assessment & Plan Note (Signed)
Stable with rare use of clonazepam

## 2018-11-09 NOTE — Progress Notes (Signed)
There were no vitals taken for this visit.   Subjective:    Patient ID: Aaron Robinson, male    DOB: 1970-08-04, 48 y.o.   MRN: [id number]  HPI: Aaron Robinson is a 48 y.o. male  Med chjeck  Discussed with patient blood pressure doing well no complaints.  Reflux stable with Nexium and nerves depression OCD stable on citalopram.  Rare use of clonazepam for anxiety. Diabetes diet controlled  Relevant past medical, surgical, family and social history reviewed and updated as indicated. Interim medical history since our last visit reviewed. Allergies and medications reviewed and updated.  Review of Systems  Constitutional: Negative.   Respiratory: Negative.   Cardiovascular: Negative.     Per HPI unless specifically indicated above     Objective:    There were no vitals taken for this visit.  Wt Readings from Last 3 Encounters:  05/08/18 278 lb (126.1 kg)  12/08/17 282 lb (127.9 kg)  09/26/17 269 lb (122 kg)    Physical Exam  Results for orders placed or performed in visit on 05/08/18  Bayer DCA Hb A1c Waived  Result Value Ref Range   HB A1C (BAYER DCA - WAIVED) 6.1 <7.0 %  CBC with Differential/Platelet  Result Value Ref Range   WBC 7.9 3.4 - 10.8 x10E3/uL   RBC 5.51 4.14 - 5.80 x10E6/uL   Hemoglobin 14.3 13.0 - 17.7 g/dL   Hematocrit 43.0 37.5 - 51.0 %   MCV 78 (L) 79 - 97 fL   MCH 26.0 (L) 26.6 - 33.0 pg   MCHC 33.3 31.5 - 35.7 g/dL   RDW 14.1 12.3 - 15.4 %   Platelets 293 150 - 450 x10E3/uL   Neutrophils 67 Not Estab. %   Lymphs 22 Not Estab. %   Monocytes 7 Not Estab. %   Eos 3 Not Estab. %   Basos 1 Not Estab. %   Neutrophils Absolute 5.3 1.4 - 7.0 x10E3/uL   Lymphocytes Absolute 1.7 0.7 - 3.1 x10E3/uL   Monocytes Absolute 0.5 0.1 - 0.9 x10E3/uL   EOS (ABSOLUTE) 0.2 0.0 - 0.4 x10E3/uL   Basophils Absolute 0.0 0.0 - 0.2 x10E3/uL   Immature Granulocytes 0 Not Estab. %   Immature Grans (Abs) 0.0 0.0 - 0.1 x10E3/uL  Comprehensive metabolic panel   Result Value Ref Range   Glucose 93 65 - 99 mg/dL   BUN 13 6 - 24 mg/dL   Creatinine, Ser 0.89 0.76 - 1.27 mg/dL   GFR calc non Af Amer 102 >59 mL/min/1.73   GFR calc Af Amer 118 >59 mL/min/1.73   BUN/Creatinine Ratio 15 9 - 20   Sodium 135 134 - 144 mmol/L   Potassium 4.1 3.5 - 5.2 mmol/L   Chloride 98 96 - 106 mmol/L   CO2 22 20 - 29 mmol/L   Calcium 9.4 8.7 - 10.2 mg/dL   Total Protein 7.7 6.0 - 8.5 g/dL   Albumin 4.3 3.5 - 5.5 g/dL   Globulin, Total 3.4 1.5 - 4.5 g/dL   Albumin/Globulin Ratio 1.3 1.2 - 2.2   Bilirubin Total 0.3 0.0 - 1.2 mg/dL   Alkaline Phosphatase 71 39 - 117 IU/L   AST 19 0 - 40 IU/L   ALT 23 0 - 44 IU/L  Lipid panel  Result Value Ref Range   Cholesterol, Total 153 100 - 199 mg/dL   Triglycerides 105 0 - 149 mg/dL   HDL 42 >39 mg/dL   VLDL Cholesterol Cal 21 5 - 40  mg/dL   LDL Calculated 90 0 - 99 mg/dL   Chol/HDL Ratio 3.6 0.0 - 5.0 ratio  PSA  Result Value Ref Range   Prostate Specific Ag, Serum 0.5 0.0 - 4.0 ng/mL  TSH  Result Value Ref Range   TSH 1.290 0.450 - 4.500 uIU/mL  Urinalysis, Routine w reflex microscopic  Result Value Ref Range   Specific Gravity, UA 1.020 1.005 - 1.030   pH, UA 5.5 5.0 - 7.5   Color, UA Yellow Yellow   Appearance Ur Clear Clear   Leukocytes, UA Negative Negative   Protein, UA Negative Negative/Trace   Glucose, UA Negative Negative   Ketones, UA Negative Negative   RBC, UA Negative Negative   Bilirubin, UA Negative Negative   Urobilinogen, Ur 0.2 0.2 - 1.0 mg/dL   Nitrite, UA Negative Negative  Homocysteine  Result Value Ref Range   Homocysteine 10.5 0.0 - 15.0 umol/L      Assessment & Plan:   Problem List Items Addressed This Visit      Cardiovascular and Mediastinum   Hypertension    The current medical regimen is effective;  continue present plan and medications.         Other   Anxiety    Stable with rare use of clonazepam      Hyperlipidemia    The current medical regimen is effective;   continue present plan and medications.       Depression    The current medical regimen is effective;  continue present plan and medications.       Prediabetes    Diet controlled and stable checking hemoglobin A1c today         Telemedicine using audio/video telecommunications for a synchronous communication visit. Today's visit due to COVID-19 isolation precautions I connected with and verified that I am speaking with the correct person using two identifiers.   I discussed the limitations, risks, security and privacy concerns of performing an evaluation and management service by telecommunication and the availability of in person appointments. I also discussed with the patient that there may be a patient responsible charge related to this service. The patient expressed understanding and agreed to proceed. The patient's location is work. I am at home.   I discussed the assessment and treatment plan with the patient. The patient was provided an opportunity to ask questions and all were answered. The patient agreed with the plan and demonstrated an understanding of the instructions.   The patient was advised to call back or seek an in-person evaluation if the symptoms worsen or if the condition fails to improve as anticipated.   I provided 21+ minutes of time during this encounter.  Follow up plan: Return in about 6 months (around 05/11/2019) for Physical Exam, Hemoglobin A1c.

## 2019-03-05 ENCOUNTER — Encounter: Payer: Self-pay | Admitting: Family Medicine

## 2019-03-05 DIAGNOSIS — R1310 Dysphagia, unspecified: Secondary | ICD-10-CM

## 2019-03-13 ENCOUNTER — Other Ambulatory Visit: Payer: Self-pay

## 2019-03-13 ENCOUNTER — Encounter: Payer: Self-pay | Admitting: Family Medicine

## 2019-03-13 ENCOUNTER — Ambulatory Visit (INDEPENDENT_AMBULATORY_CARE_PROVIDER_SITE_OTHER): Payer: 59 | Admitting: Family Medicine

## 2019-03-13 DIAGNOSIS — F419 Anxiety disorder, unspecified: Secondary | ICD-10-CM

## 2019-03-13 DIAGNOSIS — I10 Essential (primary) hypertension: Secondary | ICD-10-CM | POA: Diagnosis not present

## 2019-03-13 DIAGNOSIS — E785 Hyperlipidemia, unspecified: Secondary | ICD-10-CM | POA: Diagnosis not present

## 2019-03-13 DIAGNOSIS — F32A Depression, unspecified: Secondary | ICD-10-CM

## 2019-03-13 DIAGNOSIS — F329 Major depressive disorder, single episode, unspecified: Secondary | ICD-10-CM | POA: Diagnosis not present

## 2019-03-13 MED ORDER — CLONAZEPAM 1 MG PO TABS: 1.0000 mg | ORAL_TABLET | Freq: Every day | ORAL | 1 refills | Status: DC | PRN

## 2019-03-13 MED ORDER — ARIPIPRAZOLE 5 MG PO TABS: 5.0000 mg | ORAL_TABLET | Freq: Every day | ORAL | 2 refills | Status: DC

## 2019-03-13 NOTE — Assessment & Plan Note (Signed)
Mixture of anxiety and depression and GI complaints will start Abilify 5 mg and observe symptoms may need to increase to 10 mg

## 2019-03-13 NOTE — Progress Notes (Signed)
There were no vitals taken for this visit.   Subjective:    Patient ID: Aaron Robinson, male    DOB: Sep 30, 1970, 48 y.o.   MRN: [id number]  HPI: Aaron Robinson is a 48 y.o. male  Med check Discussed with patient having recurrence of GI issues with nausea vomiting throwing up. Patient's nerves have gotten worse wondering if something stronger than citalopram is available.  Having a great deal of irritability anxiety.  Has been using very rare clonazepam 1 or 2 months.  Started taking 1 a day for 2 days in a row and seem to help with his GI complaints. Patient's had GI complaints previously with negative work-up and response to anxiety medications.  Relevant past medical, surgical, family and social history reviewed and updated as indicated. Interim medical history since our last visit reviewed. Allergies and medications reviewed and updated.  Review of Systems  Constitutional: Negative.   Respiratory: Negative.   Cardiovascular: Negative.     Per HPI unless specifically indicated above     Objective:    There were no vitals taken for this visit.  Wt Readings from Last 3 Encounters:  05/08/18 278 lb (126.1 kg)  12/08/17 282 lb (127.9 kg)  09/26/17 269 lb (122 kg)    Physical Exam  Results for orders placed or performed in visit on 05/08/18  Bayer DCA Hb A1c Waived  Result Value Ref Range   HB A1C (BAYER DCA - WAIVED) 6.1 <7.0 %  CBC with Differential/Platelet  Result Value Ref Range   WBC 7.9 3.4 - 10.8 x10E3/uL   RBC 5.51 4.14 - 5.80 x10E6/uL   Hemoglobin 14.3 13.0 - 17.7 g/dL   Hematocrit 43.0 37.5 - 51.0 %   MCV 78 (L) 79 - 97 fL   MCH 26.0 (L) 26.6 - 33.0 pg   MCHC 33.3 31.5 - 35.7 g/dL   RDW 14.1 12.3 - 15.4 %   Platelets 293 150 - 450 x10E3/uL   Neutrophils 67 Not Estab. %   Lymphs 22 Not Estab. %   Monocytes 7 Not Estab. %   Eos 3 Not Estab. %   Basos 1 Not Estab. %   Neutrophils Absolute 5.3 1.4 - 7.0 x10E3/uL   Lymphocytes Absolute 1.7 0.7 -  3.1 x10E3/uL   Monocytes Absolute 0.5 0.1 - 0.9 x10E3/uL   EOS (ABSOLUTE) 0.2 0.0 - 0.4 x10E3/uL   Basophils Absolute 0.0 0.0 - 0.2 x10E3/uL   Immature Granulocytes 0 Not Estab. %   Immature Grans (Abs) 0.0 0.0 - 0.1 x10E3/uL  Comprehensive metabolic panel  Result Value Ref Range   Glucose 93 65 - 99 mg/dL   BUN 13 6 - 24 mg/dL   Creatinine, Ser 0.89 0.76 - 1.27 mg/dL   GFR calc non Af Amer 102 >59 mL/min/1.73   GFR calc Af Amer 118 >59 mL/min/1.73   BUN/Creatinine Ratio 15 9 - 20   Sodium 135 134 - 144 mmol/L   Potassium 4.1 3.5 - 5.2 mmol/L   Chloride 98 96 - 106 mmol/L   CO2 22 20 - 29 mmol/L   Calcium 9.4 8.7 - 10.2 mg/dL   Total Protein 7.7 6.0 - 8.5 g/dL   Albumin 4.3 3.5 - 5.5 g/dL   Globulin, Total 3.4 1.5 - 4.5 g/dL   Albumin/Globulin Ratio 1.3 1.2 - 2.2   Bilirubin Total 0.3 0.0 - 1.2 mg/dL   Alkaline Phosphatase 71 39 - 117 IU/L   AST 19 0 - 40 IU/L  ALT 23 0 - 44 IU/L  Lipid panel  Result Value Ref Range   Cholesterol, Total 153 100 - 199 mg/dL   Triglycerides 105 0 - 149 mg/dL   HDL 42 >39 mg/dL   VLDL Cholesterol Cal 21 5 - 40 mg/dL   LDL Calculated 90 0 - 99 mg/dL   Chol/HDL Ratio 3.6 0.0 - 5.0 ratio  PSA  Result Value Ref Range   Prostate Specific Ag, Serum 0.5 0.0 - 4.0 ng/mL  TSH  Result Value Ref Range   TSH 1.290 0.450 - 4.500 uIU/mL  Urinalysis, Routine w reflex microscopic  Result Value Ref Range   Specific Gravity, UA 1.020 1.005 - 1.030   pH, UA 5.5 5.0 - 7.5   Color, UA Yellow Yellow   Appearance Ur Clear Clear   Leukocytes, UA Negative Negative   Protein, UA Negative Negative/Trace   Glucose, UA Negative Negative   Ketones, UA Negative Negative   RBC, UA Negative Negative   Bilirubin, UA Negative Negative   Urobilinogen, Ur 0.2 0.2 - 1.0 mg/dL   Nitrite, UA Negative Negative  Homocysteine  Result Value Ref Range   Homocysteine 10.5 0.0 - 15.0 umol/L      Assessment & Plan:   Problem List Items Addressed This Visit       Cardiovascular and Mediastinum   Hypertension    The current medical regimen is effective;  continue present plan and medications.         Other   Anxiety    Discuss anxiety nerves GI symptoms will take clonazepam on a regular basis for a week or so and see how his nerves do.      Hyperlipidemia    The current medical regimen is effective;  continue present plan and medications.       Depression    Mixture of anxiety and depression and GI complaints will start Abilify 5 mg and observe symptoms may need to increase to 10 mg      Relevant Medications   clonazePAM (KLONOPIN) 1 MG tablet     Telemedicine using audio/video telecommunications for a synchronous communication visit. Today's visit due to COVID-19 isolation precautions I connected with and verified that I am speaking with the correct person using two identifiers.   I discussed the limitations, risks, security and privacy concerns of performing an evaluation and management service by telecommunication and the availability of in person appointments. I also discussed with the patient that there may be a patient responsible charge related to this service. The patient expressed understanding and agreed to proceed. The patient's location is work. I am at home.   I discussed the assessment and treatment plan with the patient. The patient was provided an opportunity to ask questions and all were answered. The patient agreed with the plan and demonstrated an understanding of the instructions.   The patient was advised to call back or seek an in-person evaluation if the symptoms worsen or if the condition fails to improve as anticipated.   I provided 21+ minutes of time during this encounter.  Follow up plan: No follow-ups on file.

## 2019-03-13 NOTE — Assessment & Plan Note (Signed)
The current medical regimen is effective;  continue present plan and medications.  

## 2019-03-13 NOTE — Assessment & Plan Note (Signed)
Discuss anxiety nerves GI symptoms will take clonazepam on a regular basis for a week or so and see how his nerves do.

## 2019-04-04 ENCOUNTER — Ambulatory Visit: Payer: Managed Care, Other (non HMO) | Admitting: Gastroenterology

## 2019-04-04 NOTE — Progress Notes (Deleted)
Gastroenterology Consultation  Referring Provider:     Valerie Roys, DO Primary Care Physician:  Guadalupe Maple, MD Primary Gastroenterologist:  Dr. Allen Norris     Reason for Consultation:     Dysphagia        HPI:   Aaron Robinson is a 48 y.o. y/o male referred for consultation & management of dysphagia by Dr. Jeananne Rama, Jeannette How, MD.  This patient comes in today with a referral for dysphagia.  It appears the patient had been seen in [facility] by another gastroenterologist back in 2013.  At that time the notes report that the patient had had an upper endoscopy for dysphagia and heartburn at [facility] which were reported to be normal.  The patient was then set up for manometry that showed:  Impressions The function of the lower esophageal sphincter is within normal limits.There are an elevated number of simultaneous and failed swallows of the esophageal body.Mean wave amplitude and DCI values are below the limits of normal. 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  The patient then underwent pH study and that showed:  Interpretation / Findings This study completed on PPI therapy demonstrates excellent control of acid gastroesophageal reflux. 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  The patient had an upper GI x-ray in 2007 that showed him to have spontaneous reflux and in 2013 he had an upper endoscopy that showed a duodenal nodule but no other abnormalities to explain the patient's symptoms.  Although her report for a upper endoscopy in 2008 could not be found the pathology at that time showed a  normal distal esophagus.  Past Medical History:  Diagnosis Date  . Anxiety   . Depression   . GERD (gastroesophageal reflux disease)   . Hyperlipidemia   . Hypertension     Past Surgical History:  Procedure Laterality Date  . UPPER GASTROINTESTINAL ENDOSCOPY      Prior to Admission medications   Medication Sig Start Date End Date Taking? Authorizing Provider  ARIPiprazole (ABILIFY) 5 MG tablet Take 1 tablet (5 mg total) by mouth daily. 03/13/19   Guadalupe Maple, MD  atenolol (TENORMIN) 100 MG tablet Take 1 tablet (100 mg total) by mouth daily. 05/08/18   Guadalupe Maple, MD  benazepril (LOTENSIN) 10 MG tablet Take 1 tablet (10 mg total) by mouth daily. 05/08/18   Guadalupe Maple, MD  citalopram (CELEXA) 40 MG tablet Take 1 tablet (40 mg total) by mouth daily. 05/08/18   Guadalupe Maple, MD  clonazePAM (KLONOPIN) 1 MG tablet Take 1 tablet (1 mg total) by mouth daily as needed for anxiety (1/2 each AM and 1/2 PRN). 03/13/19   Guadalupe Maple, MD  esomeprazole (NEXIUM) 40 MG capsule Take 1 capsule (40 mg total) by mouth 2 (two) times daily before a meal. 05/08/18   Guadalupe Maple, MD    Family History  Problem Relation Age of Onset  . Hypertension Mother   . Hypertension Father   . Heart disease Father   . Heart attack Father   . Cancer Paternal Uncle        throat     Social History   Tobacco Use  .  Smoking status: Former Smoker    Types: Cigarettes  . Smokeless tobacco: Former Systems developer    Types: Chew  Substance Use Topics  . Alcohol use: Yes    Alcohol/week: 0.0 standard drinks    Comment: occassionql  . Drug use: No    Allergies as of 04/04/2019  . (No Known Allergies)    Review of Systems:    All systems reviewed and negative except where noted in HPI.   Physical Exam:  There were no vitals taken for this visit. No LMP for male patient. General:   Alert,  Well-developed, well-nourished, pleasant and cooperative in NAD Head:  Normocephalic and  atraumatic. Eyes:  Sclera clear, no icterus.   Conjunctiva pink. Ears:  Normal auditory acuity. Neck:  Supple; no masses or thyromegaly. Lungs:  Respirations even and unlabored.  Clear throughout to auscultation.   No wheezes, crackles, or rhonchi. No acute distress. Heart:  Regular rate and rhythm; no murmurs, clicks, rubs, or gallops. Abdomen:  Normal bowel sounds.  No bruits.  Soft, non-tender and non-distended without masses, hepatosplenomegaly or hernias noted.  No guarding or rebound tenderness.  Negative Carnett sign.   Rectal:  Deferred.  Msk:  Symmetrical without gross deformities.  Good, equal movement & strength bilaterally. Pulses:  Normal pulses noted. Extremities:  No clubbing or edema.  No cyanosis. Neurologic:  Alert and oriented x3;  grossly normal neurologically. Skin:  Intact without significant lesions or rashes.  No jaundice. Lymph Nodes:  No significant cervical adenopathy. Psych:  Alert and cooperative. Normal mood and affect.  Imaging Studies: No results found.  Assessment and Plan:   [name] is a 48 y.o. y/o male ***    Lucilla Lame, MD. Marval Regal    Note: This dictation was prepared with Dragon dictation along with smaller phrase technology. Any transcriptional errors that result from this process are unintentional.

## 2019-04-11 ENCOUNTER — Encounter: Payer: Self-pay | Admitting: Family Medicine

## 2019-04-16 ENCOUNTER — Other Ambulatory Visit: Payer: Self-pay

## 2019-04-16 ENCOUNTER — Encounter: Payer: Self-pay | Admitting: Family Medicine

## 2019-04-16 ENCOUNTER — Ambulatory Visit (INDEPENDENT_AMBULATORY_CARE_PROVIDER_SITE_OTHER): Payer: 59 | Admitting: Family Medicine

## 2019-04-16 VITALS — BP 131/86 | HR 80 | Temp 98.4°F | Ht 70.6 in | Wt 279.0 lb

## 2019-04-16 DIAGNOSIS — F419 Anxiety disorder, unspecified: Secondary | ICD-10-CM | POA: Diagnosis not present

## 2019-04-16 DIAGNOSIS — Z23 Encounter for immunization: Secondary | ICD-10-CM

## 2019-04-16 DIAGNOSIS — F331 Major depressive disorder, recurrent, moderate: Secondary | ICD-10-CM

## 2019-04-16 MED ORDER — DULOXETINE HCL 30 MG PO CPEP: ORAL_CAPSULE | ORAL | 0 refills | Status: DC

## 2019-04-16 MED ORDER — ARIPIPRAZOLE 2 MG PO TABS: 2.0000 mg | ORAL_TABLET | Freq: Every day | ORAL | 0 refills | Status: DC

## 2019-04-16 NOTE — Patient Instructions (Signed)
Let's meet back 1 month from today if you decide to take 2 mg abilify with your cymbalta, but if you want to come off the abilify go ahead and make your follow up appointment 1 month from the day you come off the abilify

## 2019-04-16 NOTE — Progress Notes (Signed)
BP 131/86   Pulse 80   Temp 98.4 F (36.9 C) (Oral)   Ht 5' 10.6" (1.793 m)   Wt 279 lb (126.6 kg)   SpO2 96%   BMI 39.35 kg/m    Subjective:    Patient ID: Aaron Robinson, male    DOB: 07/23/1970, 48 y.o.   MRN: [id number]  HPI: Aaron Robinson is a 48 y.o. male  Chief Complaint  Patient presents with  . Anxiety    medication f/u  . Depression   Patient presenting today for 1 month mood and anxiety f/u.   His depression, anxiety and agitation which he states are not under good control. Celexa was d/c'd at last visit, abilify 5 mg started. Per patient, he texted his previous PCP a few weeks back and was told he could increase the abilify to 10 mg daily. Since doing this, he states he has felt like a zombie and does not think it's helping control his moods as well as the celexa. Did the best on cymbalta, which he was on years ago. Continues to have GI sxs with his anxiety episodes as well. Taking about 12-15 klonopin monthly on average.  Depression screen [facility] 2/9 04/16/2019 12/08/2017 09/26/2017  Decreased Interest 2 0 0  Down, Depressed, Hopeless 0 0 0  PHQ - 2 Score 2 0 0  Altered sleeping 3 - -  Tired, decreased energy 2 - -  Change in appetite 1 - -  Feeling bad or failure about yourself  1 - -  Trouble concentrating 2 - -  Moving slowly or fidgety/restless 0 - -  Suicidal thoughts 0 - -  PHQ-9 Score 11 - -   GAD 7 : Generalized Anxiety Score 04/16/2019 02/11/2016  Nervous, Anxious, on Edge 2 3  Control/stop worrying 2 3  Worry too much - different things 2 3  Trouble relaxing 3 3  Restless 1 0  Easily annoyed or irritable 3 3  Afraid - awful might happen 2 2  Total GAD 7 Score 15 17  Anxiety Difficulty Somewhat difficult -   Relevant past medical, surgical, family and social history reviewed and updated as indicated. Interim medical history since our last visit reviewed. Allergies and medications reviewed and updated.  Review of Systems  Per HPI unless  specifically indicated above     Objective:    BP 131/86   Pulse 80   Temp 98.4 F (36.9 C) (Oral)   Ht 5' 10.6" (1.793 m)   Wt 279 lb (126.6 kg)   SpO2 96%   BMI 39.35 kg/m   Wt Readings from Last 3 Encounters:  04/16/19 279 lb (126.6 kg)  05/08/18 278 lb (126.1 kg)  12/08/17 282 lb (127.9 kg)    Physical Exam Vitals signs and nursing note reviewed.  Constitutional:      Appearance: Normal appearance.  HENT:     Head: Atraumatic.  Eyes:     Extraocular Movements: Extraocular movements intact.     Conjunctiva/sclera: Conjunctivae normal.  Neck:     Musculoskeletal: Normal range of motion and neck supple.  Cardiovascular:     Rate and Rhythm: Normal rate and regular rhythm.  Pulmonary:     Effort: Pulmonary effort is normal.     Breath sounds: Normal breath sounds.  Musculoskeletal: Normal range of motion.  Skin:    General: Skin is warm and dry.  Neurological:     General: No focal deficit present.     Mental Status: He  is oriented to person, place, and time.  Psychiatric:        Mood and Affect: Mood normal.        Thought Content: Thought content normal.        Judgment: Judgment normal.     Results for orders placed or performed in visit on 05/08/18  Bayer DCA Hb A1c Waived  Result Value Ref Range   HB A1C (BAYER DCA - WAIVED) 6.1 <7.0 %  CBC with Differential/Platelet  Result Value Ref Range   WBC 7.9 3.4 - 10.8 x10E3/uL   RBC 5.51 4.14 - 5.80 x10E6/uL   Hemoglobin 14.3 13.0 - 17.7 g/dL   Hematocrit 43.0 37.5 - 51.0 %   MCV 78 (L) 79 - 97 fL   MCH 26.0 (L) 26.6 - 33.0 pg   MCHC 33.3 31.5 - 35.7 g/dL   RDW 14.1 12.3 - 15.4 %   Platelets 293 150 - 450 x10E3/uL   Neutrophils 67 Not Estab. %   Lymphs 22 Not Estab. %   Monocytes 7 Not Estab. %   Eos 3 Not Estab. %   Basos 1 Not Estab. %   Neutrophils Absolute 5.3 1.4 - 7.0 x10E3/uL   Lymphocytes Absolute 1.7 0.7 - 3.1 x10E3/uL   Monocytes Absolute 0.5 0.1 - 0.9 x10E3/uL   EOS (ABSOLUTE) 0.2 0.0 -  0.4 x10E3/uL   Basophils Absolute 0.0 0.0 - 0.2 x10E3/uL   Immature Granulocytes 0 Not Estab. %   Immature Grans (Abs) 0.0 0.0 - 0.1 x10E3/uL  Comprehensive metabolic panel  Result Value Ref Range   Glucose 93 65 - 99 mg/dL   BUN 13 6 - 24 mg/dL   Creatinine, Ser 0.89 0.76 - 1.27 mg/dL   GFR calc non Af Amer 102 >59 mL/min/1.73   GFR calc Af Amer 118 >59 mL/min/1.73   BUN/Creatinine Ratio 15 9 - 20   Sodium 135 134 - 144 mmol/L   Potassium 4.1 3.5 - 5.2 mmol/L   Chloride 98 96 - 106 mmol/L   CO2 22 20 - 29 mmol/L   Calcium 9.4 8.7 - 10.2 mg/dL   Total Protein 7.7 6.0 - 8.5 g/dL   Albumin 4.3 3.5 - 5.5 g/dL   Globulin, Total 3.4 1.5 - 4.5 g/dL   Albumin/Globulin Ratio 1.3 1.2 - 2.2   Bilirubin Total 0.3 0.0 - 1.2 mg/dL   Alkaline Phosphatase 71 39 - 117 IU/L   AST 19 0 - 40 IU/L   ALT 23 0 - 44 IU/L  Lipid panel  Result Value Ref Range   Cholesterol, Total 153 100 - 199 mg/dL   Triglycerides 105 0 - 149 mg/dL   HDL 42 >39 mg/dL   VLDL Cholesterol Cal 21 5 - 40 mg/dL   LDL Calculated 90 0 - 99 mg/dL   Chol/HDL Ratio 3.6 0.0 - 5.0 ratio  PSA  Result Value Ref Range   Prostate Specific Ag, Serum 0.5 0.0 - 4.0 ng/mL  TSH  Result Value Ref Range   TSH 1.290 0.450 - 4.500 uIU/mL  Urinalysis, Routine w reflex microscopic  Result Value Ref Range   Specific Gravity, UA 1.020 1.005 - 1.030   pH, UA 5.5 5.0 - 7.5   Color, UA Yellow Yellow   Appearance Ur Clear Clear   Leukocytes, UA Negative Negative   Protein, UA Negative Negative/Trace   Glucose, UA Negative Negative   Ketones, UA Negative Negative   RBC, UA Negative Negative   Bilirubin, UA Negative Negative  Urobilinogen, Ur 0.2 0.2 - 1.0 mg/dL   Nitrite, UA Negative Negative  Homocysteine  Result Value Ref Range   Homocysteine 10.5 0.0 - 15.0 umol/L      Assessment & Plan:   Problem List Items Addressed This Visit      Other   Anxiety    Restart Cymbalta, continue rare prn use of klonopin with goal of 1  script lasting about 3 months      Relevant Medications   DULoxetine (CYMBALTA) 30 MG capsule   Depression    Will restart cymbalta as this seemed to work the best for him previously. He is unsure if he would rather taper off the abilify or try the 2 mg dose in addition. Low dose abilify sent to pharmacy in case.       Relevant Medications   DULoxetine (CYMBALTA) 30 MG capsule    Other Visit Diagnoses    Flu vaccine need    -  Primary   Relevant Orders   Flu Vaccine QUAD 6+ mos PF IM (Fluarix Quad PF) (Completed)       Follow up plan: Return in about 4 weeks (around 05/14/2019) for Anxiety.

## 2019-04-17 ENCOUNTER — Encounter: Payer: Self-pay | Admitting: Family Medicine

## 2019-04-17 NOTE — Assessment & Plan Note (Signed)
Will restart cymbalta as this seemed to work the best for him previously. He is unsure if he would rather taper off the abilify or try the 2 mg dose in addition. Low dose abilify sent to pharmacy in case.

## 2019-04-17 NOTE — Assessment & Plan Note (Signed)
Restart Cymbalta, continue rare prn use of klonopin with goal of 1 script lasting about 3 months

## 2019-04-30 ENCOUNTER — Ambulatory Visit: Payer: Self-pay | Admitting: Gastroenterology

## 2019-04-30 ENCOUNTER — Other Ambulatory Visit: Payer: Self-pay | Admitting: Family Medicine

## 2019-04-30 ENCOUNTER — Encounter: Payer: Self-pay | Admitting: Family Medicine

## 2019-04-30 MED ORDER — DULOXETINE HCL 60 MG PO CPEP: 60.0000 mg | ORAL_CAPSULE | Freq: Every day | ORAL | 1 refills | Status: DC

## 2019-05-14 ENCOUNTER — Encounter: Payer: 59 | Admitting: Family Medicine

## 2019-05-16 ENCOUNTER — Encounter: Payer: Self-pay | Admitting: Family Medicine

## 2019-05-16 ENCOUNTER — Other Ambulatory Visit: Payer: Self-pay

## 2019-05-16 ENCOUNTER — Ambulatory Visit (INDEPENDENT_AMBULATORY_CARE_PROVIDER_SITE_OTHER): Payer: 59 | Admitting: Family Medicine

## 2019-05-16 VITALS — Ht 71.0 in

## 2019-05-16 DIAGNOSIS — F419 Anxiety disorder, unspecified: Secondary | ICD-10-CM | POA: Diagnosis not present

## 2019-05-16 DIAGNOSIS — F331 Major depressive disorder, recurrent, moderate: Secondary | ICD-10-CM

## 2019-05-16 NOTE — Assessment & Plan Note (Signed)
Stable and under good control, continue cymbalta

## 2019-05-16 NOTE — Progress Notes (Signed)
Ht 5\' 11"  (1.803 m)   BMI 38.91 kg/m    Subjective:    Patient ID: Aaron Robinson, male    DOB: Dec 27, 1970, 48 y.o.   MRN: [id number]  HPI: Aaron Robinson is a 48 y.o. male  Chief Complaint  Patient presents with  . Anxiety  . Depression    . This visit was completed via WebEx due to the restrictions of the COVID-19 pandemic. All issues as above were discussed and addressed. Physical exam was done as above through visual confirmation on WebEx. If it was felt that the patient should be evaluated in the office, they were directed there. The patient verbally consented to this visit. . Location of the patient: work . Location of the provider: work . Those involved with this call:  . Provider: Merrie Roof, PA-C . CMA: Lesle Chris, [place] . Front Desk/Registration: Jill Side  . Time spent on call: 15 minutes with patient face to face via video conference. More than 50% of this time was spent in counseling and coordination of care. 5 minutes total spent in review of patient's record and preparation of their chart. I verified patient identity using two factors (patient name and date of birth). Patient consents verbally to being seen via telemedicine visit today.   Presenting today for mood f/u. Currently on 60 mg cymbalta which is going very well overall for him. No side effects, and feels his moods and anxiety are in a much better place. Had been able to come off the klonopin once the cymbalta got up to dose but then grandmother passed away last week and he did have to take a few during that time. Trying to come back off now. States he has 1 refill left to get at the drugstore still. Denies SI/HI.   Depression screen [facility] 2/9 05/16/2019 04/16/2019 12/08/2017  Decreased Interest 2 2 0  Down, Depressed, Hopeless 3 0 0  PHQ - 2 Score 5 2 0  Altered sleeping 2 3 -  Tired, decreased energy 2 2 -  Change in appetite 1 1 -  Feeling bad or failure about yourself  1 1 -  Trouble  concentrating 2 2 -  Moving slowly or fidgety/restless 1 0 -  Suicidal thoughts 0 0 -  PHQ-9 Score 14 11 -  Difficult doing work/chores Somewhat difficult - -   GAD 7 : Generalized Anxiety Score 05/16/2019 04/16/2019 02/11/2016  Nervous, Anxious, on Edge 2 2 3   Control/stop worrying 2 2 3   Worry too much - different things 2 2 3   Trouble relaxing 0 3 3  Restless 0 1 0  Easily annoyed or irritable 0 3 3  Afraid - awful might happen 1 2 2   Total GAD 7 Score 7 15 17   Anxiety Difficulty Not difficult at all Somewhat difficult -   Relevant past medical, surgical, family and social history reviewed and updated as indicated. Interim medical history since our last visit reviewed. Allergies and medications reviewed and updated.  Review of Systems  Per HPI unless specifically indicated above     Objective:    Ht 5\' 11"  (1.803 m)   BMI 38.91 kg/m   Wt Readings from Last 3 Encounters:  04/16/19 279 lb (126.6 kg)  05/08/18 278 lb (126.1 kg)  12/08/17 282 lb (127.9 kg)    Physical Exam Vitals and nursing note reviewed.  Constitutional:      General: He is not in acute distress.    Appearance: Normal appearance.  HENT:     Head: Atraumatic.     Right Ear: External ear normal.     Left Ear: External ear normal.     Nose: Nose normal. No congestion.     Mouth/Throat:     Mouth: Mucous membranes are moist.     Pharynx: Oropharynx is clear.  Eyes:     Extraocular Movements: Extraocular movements intact.     Conjunctiva/sclera: Conjunctivae normal.  Cardiovascular:     Rate and Rhythm: Normal rate and regular rhythm.  Pulmonary:     Effort: Pulmonary effort is normal. No respiratory distress.  Musculoskeletal:        General: Normal range of motion.     Cervical back: Normal range of motion.  Skin:    General: Skin is dry.     Findings: No erythema or rash.  Neurological:     Mental Status: He is oriented to person, place, and time.  Psychiatric:        Mood and Affect:  Mood normal.        Thought Content: Thought content normal.        Judgment: Judgment normal.     Results for orders placed or performed in visit on 05/08/18  Bayer DCA Hb A1c Waived  Result Value Ref Range   HB A1C (BAYER DCA - WAIVED) 6.1 <7.0 %  CBC with Differential/Platelet  Result Value Ref Range   WBC 7.9 3.4 - 10.8 x10E3/uL   RBC 5.51 4.14 - 5.80 x10E6/uL   Hemoglobin 14.3 13.0 - 17.7 g/dL   Hematocrit 43.0 37.5 - 51.0 %   MCV 78 (L) 79 - 97 fL   MCH 26.0 (L) 26.6 - 33.0 pg   MCHC 33.3 31.5 - 35.7 g/dL   RDW 14.1 12.3 - 15.4 %   Platelets 293 150 - 450 x10E3/uL   Neutrophils 67 Not Estab. %   Lymphs 22 Not Estab. %   Monocytes 7 Not Estab. %   Eos 3 Not Estab. %   Basos 1 Not Estab. %   Neutrophils Absolute 5.3 1.4 - 7.0 x10E3/uL   Lymphocytes Absolute 1.7 0.7 - 3.1 x10E3/uL   Monocytes Absolute 0.5 0.1 - 0.9 x10E3/uL   EOS (ABSOLUTE) 0.2 0.0 - 0.4 x10E3/uL   Basophils Absolute 0.0 0.0 - 0.2 x10E3/uL   Immature Granulocytes 0 Not Estab. %   Immature Grans (Abs) 0.0 0.0 - 0.1 x10E3/uL  Comprehensive metabolic panel  Result Value Ref Range   Glucose 93 65 - 99 mg/dL   BUN 13 6 - 24 mg/dL   Creatinine, Ser 0.89 0.76 - 1.27 mg/dL   GFR calc non Af Amer 102 >59 mL/min/1.73   GFR calc Af Amer 118 >59 mL/min/1.73   BUN/Creatinine Ratio 15 9 - 20   Sodium 135 134 - 144 mmol/L   Potassium 4.1 3.5 - 5.2 mmol/L   Chloride 98 96 - 106 mmol/L   CO2 22 20 - 29 mmol/L   Calcium 9.4 8.7 - 10.2 mg/dL   Total Protein 7.7 6.0 - 8.5 g/dL   Albumin 4.3 3.5 - 5.5 g/dL   Globulin, Total 3.4 1.5 - 4.5 g/dL   Albumin/Globulin Ratio 1.3 1.2 - 2.2   Bilirubin Total 0.3 0.0 - 1.2 mg/dL   Alkaline Phosphatase 71 39 - 117 IU/L   AST 19 0 - 40 IU/L   ALT 23 0 - 44 IU/L  Lipid panel  Result Value Ref Range   Cholesterol, Total 153 100 - 199 mg/dL  Triglycerides 105 0 - 149 mg/dL   HDL 42 >39 mg/dL   VLDL Cholesterol Cal 21 5 - 40 mg/dL   LDL Calculated 90 0 - 99 mg/dL    Chol/HDL Ratio 3.6 0.0 - 5.0 ratio  PSA  Result Value Ref Range   Prostate Specific Ag, Serum 0.5 0.0 - 4.0 ng/mL  TSH  Result Value Ref Range   TSH 1.290 0.450 - 4.500 uIU/mL  Urinalysis, Routine w reflex microscopic  Result Value Ref Range   Specific Gravity, UA 1.020 1.005 - 1.030   pH, UA 5.5 5.0 - 7.5   Color, UA Yellow Yellow   Appearance Ur Clear Clear   Leukocytes, UA Negative Negative   Protein, UA Negative Negative/Trace   Glucose, UA Negative Negative   Ketones, UA Negative Negative   RBC, UA Negative Negative   Bilirubin, UA Negative Negative   Urobilinogen, Ur 0.2 0.2 - 1.0 mg/dL   Nitrite, UA Negative Negative  Homocysteine  Result Value Ref Range   Homocysteine 10.5 0.0 - 15.0 umol/L      Assessment & Plan:   Problem List Items Addressed This Visit      Other   Anxiety    Significantly improved on cymbalta, continue rare prn use of klonopin with goal of 1 script lasting 3-6 months      Depression - Primary    Stable and under good control, continue cymbalta          Follow up plan: Return in about 6 months (around 11/14/2019) for CPE.

## 2019-05-16 NOTE — Assessment & Plan Note (Signed)
Significantly improved on cymbalta, continue rare prn use of klonopin with goal of 1 script lasting 3-6 months

## 2019-05-22 ENCOUNTER — Encounter: Payer: 59 | Admitting: Family Medicine

## 2019-06-18 ENCOUNTER — Other Ambulatory Visit: Payer: Self-pay | Admitting: Family Medicine

## 2019-06-18 ENCOUNTER — Ambulatory Visit (INDEPENDENT_AMBULATORY_CARE_PROVIDER_SITE_OTHER): Payer: 59 | Admitting: Family Medicine

## 2019-06-18 ENCOUNTER — Encounter: Payer: Self-pay | Admitting: Family Medicine

## 2019-06-18 ENCOUNTER — Other Ambulatory Visit: Payer: Self-pay

## 2019-06-18 VITALS — BP 130/82 | HR 75 | Temp 98.8°F | Ht 70.0 in | Wt 275.0 lb

## 2019-06-18 DIAGNOSIS — I10 Essential (primary) hypertension: Secondary | ICD-10-CM

## 2019-06-18 DIAGNOSIS — F331 Major depressive disorder, recurrent, moderate: Secondary | ICD-10-CM

## 2019-06-18 DIAGNOSIS — F419 Anxiety disorder, unspecified: Secondary | ICD-10-CM | POA: Diagnosis not present

## 2019-06-18 DIAGNOSIS — K219 Gastro-esophageal reflux disease without esophagitis: Secondary | ICD-10-CM | POA: Diagnosis not present

## 2019-06-18 DIAGNOSIS — F32A Depression, unspecified: Secondary | ICD-10-CM

## 2019-06-18 DIAGNOSIS — Z0000 Encounter for general adult medical examination without abnormal findings: Secondary | ICD-10-CM

## 2019-06-18 DIAGNOSIS — E785 Hyperlipidemia, unspecified: Secondary | ICD-10-CM | POA: Diagnosis not present

## 2019-06-18 DIAGNOSIS — R7303 Prediabetes: Secondary | ICD-10-CM

## 2019-06-18 DIAGNOSIS — L739 Follicular disorder, unspecified: Secondary | ICD-10-CM

## 2019-06-18 DIAGNOSIS — F329 Major depressive disorder, single episode, unspecified: Secondary | ICD-10-CM

## 2019-06-18 DIAGNOSIS — Z6839 Body mass index (BMI) 39.0-39.9, adult: Secondary | ICD-10-CM

## 2019-06-18 LAB — CBC WITH DIFFERENTIAL/PLATELET
Hematocrit: 45 % (ref 37.5–51.0)
Hemoglobin: 15.8 g/dL (ref 13.0–17.7)
Lymphocytes Absolute: 1.8 10*3/uL (ref 0.7–3.1)
Lymphs: 61 %
MCH: 28.1 pg (ref 26.6–33.0)
MCHC: 35.1 g/dL (ref 31.5–35.7)
MCV: 80 fL (ref 79–97)
MID (Absolute): 1.1 10*3/uL (ref 0.1–1.6)
MID: 14 %
Neutrophils Absolute: 4.5 10*3/uL (ref 1.4–7.0)
Neutrophils: 25 %
Platelets: 238 10*3/uL (ref 150–450)
RBC: 5.62 x10E6/uL (ref 4.14–5.80)
RDW: 14.8 % (ref 11.6–15.4)
WBC: 7.4 10*3/uL (ref 3.4–10.8)

## 2019-06-18 LAB — UA/M W/RFLX CULTURE, ROUTINE
Bilirubin, UA: NEGATIVE
Glucose, UA: NEGATIVE
Ketones, UA: NEGATIVE
Leukocytes,UA: NEGATIVE
Nitrite, UA: NEGATIVE
Protein,UA: NEGATIVE
RBC, UA: NEGATIVE
Specific Gravity, UA: 1.025 (ref 1.005–1.030)
Urobilinogen, Ur: 1 mg/dL (ref 0.2–1.0)
pH, UA: 6 (ref 5.0–7.5)

## 2019-06-18 LAB — MICROALBUMIN, URINE WAIVED
Creatinine, Urine Waived: 200 mg/dL (ref 10–300)
Microalb, Ur Waived: 30 mg/L — ABNORMAL HIGH (ref 0–19)
Microalb/Creat Ratio: <30 mg/g (ref <30)

## 2019-06-18 LAB — BAYER DCA HB A1C WAIVED: HB A1C (BAYER DCA - WAIVED): 5.9 % (ref <7.0)

## 2019-06-18 MED ORDER — BENAZEPRIL HCL 10 MG PO TABS: 10.0000 mg | ORAL_TABLET | Freq: Every day | ORAL | 1 refills | Status: DC

## 2019-06-18 MED ORDER — CLONAZEPAM 1 MG PO TABS: 1.0000 mg | ORAL_TABLET | Freq: Every day | ORAL | 1 refills | Status: DC | PRN

## 2019-06-18 MED ORDER — ESOMEPRAZOLE MAGNESIUM 40 MG PO CPDR: 40.0000 mg | DELAYED_RELEASE_CAPSULE | Freq: Two times a day (BID) | ORAL | 1 refills | Status: DC

## 2019-06-18 MED ORDER — DULOXETINE HCL 60 MG PO CPEP: 60.0000 mg | ORAL_CAPSULE | Freq: Every day | ORAL | 1 refills | Status: DC

## 2019-06-18 MED ORDER — ATENOLOL 100 MG PO TABS: 100.0000 mg | ORAL_TABLET | Freq: Every day | ORAL | 1 refills | Status: DC

## 2019-06-18 MED ORDER — MUPIROCIN 2 % EX OINT: 1.0000 "application " | TOPICAL_OINTMENT | Freq: Two times a day (BID) | CUTANEOUS | 0 refills | Status: AC

## 2019-06-18 NOTE — Assessment & Plan Note (Signed)
Rechecking levels today. Await results. Treat as needed.  

## 2019-06-18 NOTE — Assessment & Plan Note (Signed)
Stable with A1c of 5.9. Continue diet and exercise. Call with any concerns. Continue to monitor.

## 2019-06-18 NOTE — Assessment & Plan Note (Signed)
Stable. Using klonopin very occasionally. Still has a refill. Refill given. Discussed adding wellbutrin to try to keep him under better control. He will consider and let us know if he'd like to do that. Continue to monitor.

## 2019-06-18 NOTE — Progress Notes (Signed)
BP 130/82   Pulse 75   Temp 98.8 F (37.1 C) (Oral)   Ht 5\' 10"  (1.778 m)   Wt 275 lb (124.7 kg)   SpO2 95%   BMI 39.46 kg/m    Subjective:    Patient ID: Aaron Robinson, male    DOB: 02-23-1971, 49 y.o.   MRN: [id number]  HPI: Aaron Robinson is a 49 y.o. male presenting on 06/18/2019 for comprehensive medical examination. Current medical complaints include:  HYPERTENSION / HYPERLIPIDEMIA Satisfied with current treatment? yes Duration of hypertension: chronic BP monitoring frequency: not checking BP medication side effects: no Past BP meds: benazepril, atenolol Duration of hyperlipidemia: chronic Cholesterol medication side effects: not on anything Cholesterol supplements: none Past cholesterol medications: none Medication compliance: excellent compliance Aspirin: no Recent stressors: yes Recurrent headaches: no Visual changes: no Palpitations: no Dyspnea: no Chest pain: no Lower extremity edema: no Dizzy/lightheaded: no  Impaired Fasting Glucose HbA1C:  Lab Results  Component Value Date   HGBA1C 6.1 05/08/2018   Duration of elevated blood sugar: chronic Polydipsia: no Polyuria: no Weight change: no Visual disturbance: no Glucose Monitoring: no Diabetic Education: Not Completed Family history of diabetes: yes  ANXIETY/DEPRESSION Duration:stable Anxious mood: yes  Excessive worrying: yes Irritability: no  Sweating: no Nausea: no Palpitations:no Hyperventilation: no Panic attacks: no Agoraphobia: no  Obscessions/compulsions: no Depressed mood: yes Depression screen [facility] 2/9 06/18/2019 05/16/2019 04/16/2019 12/08/2017 09/26/2017  Decreased Interest 2 2 2  0 0  Down, Depressed, Hopeless 1 3 0 0 0  PHQ - 2 Score 3 5 2  0 0  Altered sleeping 2 2 3  - -  Tired, decreased energy 1 2 2  - -  Change in appetite 0 1 1 - -  Feeling bad or failure about yourself  0 1 1 - -  Trouble concentrating 2 2 2  - -  Moving slowly or fidgety/restless 0 1 0 - -   Suicidal thoughts 0 0 0 - -  PHQ-9 Score 8 14 11  - -  Difficult doing work/chores Somewhat difficult Somewhat difficult - - -   GAD 7 : Generalized Anxiety Score 06/18/2019 05/16/2019 04/16/2019 02/11/2016  Nervous, Anxious, on Edge 3 2 2 3   Control/stop worrying 3 2 2 3   Worry too much - different things 3 2 2 3   Trouble relaxing 2 0 3 3  Restless 0 0 1 0  Easily annoyed or irritable 0 0 3 3  Afraid - awful might happen 1 1 2 2   Total GAD 7 Score 12 7 15 17   Anxiety Difficulty Somewhat difficult Not difficult at all Somewhat difficult -   Anhedonia: no Weight changes: no Insomnia: no   Hypersomnia: no Fatigue/loss of energy: yes Feelings of worthlessness: no Feelings of guilt: no Impaired concentration/indecisiveness: no Suicidal ideations: no  Crying spells: no Recent Stressors/Life Changes: yes   Relationship problems: no   Family stress: no     Financial stress: no    Job stress: no    Recent death/loss: no  Interim Problems from his last visit: no  Depression Screen done today and results listed below:  Depression screen [facility] 2/9 06/18/2019 05/16/2019 04/16/2019 12/08/2017 09/26/2017  Decreased Interest 2 2 2  0 0  Down, Depressed, Hopeless 1 3 0 0 0  PHQ - 2 Score 3 5 2  0 0  Altered sleeping 2 2 3  - -  Tired, decreased energy 1 2 2  - -  Change in appetite 0 1 1 - -  Feeling bad or  failure about yourself  0 1 1 - -  Trouble concentrating 2 2 2  - -  Moving slowly or fidgety/restless 0 1 0 - -  Suicidal thoughts 0 0 0 - -  PHQ-9 Score 8 14 11  - -  Difficult doing work/chores Somewhat difficult Somewhat difficult - - -    Past Medical History:  Past Medical History:  Diagnosis Date  . Anxiety   . Depression   . GERD (gastroesophageal reflux disease)   . Hyperlipidemia   . Hypertension     Surgical History:  Past Surgical History:  Procedure Laterality Date  . UPPER GASTROINTESTINAL ENDOSCOPY      Medications:  No current outpatient medications on file  prior to visit.   No current facility-administered medications on file prior to visit.    Allergies:  No Known Allergies  Social History:  Social History   Socioeconomic History  . Marital status: Single    Spouse name: Not on file  . Number of children: Not on file  . Years of education: Not on file  . Highest education level: Not on file  Occupational History  . Not on file  Tobacco Use  . Smoking status: Former Smoker    Types: Cigarettes  . Smokeless tobacco: Former Systems developer    Types: Chew  Substance and Sexual Activity  . Alcohol use: Yes    Alcohol/week: 0.0 standard drinks    Comment: occassionql  . Drug use: No  . Sexual activity: Not on file  Other Topics Concern  . Not on file  Social History Narrative  . Not on file   Social Determinants of Health   Financial Resource Strain:   . Difficulty of Paying Living Expenses: Not on file  Food Insecurity:   . Worried About Charity fundraiser in the Last Year: Not on file  . Ran Out of Food in the Last Year: Not on file  Transportation Needs:   . Lack of Transportation (Medical): Not on file  . Lack of Transportation (Non-Medical): Not on file  Physical Activity:   . Days of Exercise per Week: Not on file  . Minutes of Exercise per Session: Not on file  Stress:   . Feeling of Stress : Not on file  Social Connections:   . Frequency of Communication with Friends and Family: Not on file  . Frequency of Social Gatherings with Friends and Family: Not on file  . Attends Religious Services: Not on file  . Active Member of Clubs or Organizations: Not on file  . Attends Archivist Meetings: Not on file  . Marital Status: Not on file  Intimate Partner Violence:   . Fear of Current or Ex-Partner: Not on file  . Emotionally Abused: Not on file  . Physically Abused: Not on file  . Sexually Abused: Not on file   Social History   Tobacco Use  Smoking Status Former Smoker  . Types: Cigarettes  Smokeless  Tobacco Former Systems developer  . Types: Chew   Social History   Substance and Sexual Activity  Alcohol Use Yes  . Alcohol/week: 0.0 standard drinks   Comment: occassionql    Family History:  Family History  Problem Relation Age of Onset  . Hypertension Mother   . Hypertension Father   . Heart disease Father   . Heart attack Father   . Cancer Paternal Uncle        throat    Past medical history, surgical history, medications, allergies, family  history and social history reviewed with patient today and changes made to appropriate areas of the chart.   Review of Systems  Constitutional: Negative.   HENT: Negative.   Eyes: Negative.   Respiratory: Negative.   Cardiovascular: Negative.   Gastrointestinal: Positive for heartburn. Negative for abdominal pain, blood in stool, constipation, diarrhea, melena, nausea and vomiting.  Genitourinary: Negative.   Musculoskeletal: Negative.   Skin: Positive for rash. Negative for itching.  Neurological: Negative.   Endo/Heme/Allergies: Negative.   Psychiatric/Behavioral: Positive for depression. Negative for hallucinations, memory loss, substance abuse and suicidal ideas. The patient is nervous/anxious. The patient does not have insomnia.     All other ROS negative except what is listed above and in the HPI.      Objective:    BP 130/82   Pulse 75   Temp 98.8 F (37.1 C) (Oral)   Ht 5\' 10"  (1.778 m)   Wt 275 lb (124.7 kg)   SpO2 95%   BMI 39.46 kg/m   Wt Readings from Last 3 Encounters:  06/18/19 275 lb (124.7 kg)  04/16/19 279 lb (126.6 kg)  05/08/18 278 lb (126.1 kg)    Physical Exam Vitals and nursing note reviewed.  Constitutional:      General: He is not in acute distress.    Appearance: Normal appearance. He is obese. He is not ill-appearing, toxic-appearing or diaphoretic.  HENT:     Head: Normocephalic and atraumatic.     Right Ear: Tympanic membrane, ear canal and external ear normal. There is no impacted cerumen.      Left Ear: Tympanic membrane, ear canal and external ear normal. There is no impacted cerumen.     Nose: Nose normal. No congestion or rhinorrhea.     Mouth/Throat:     Mouth: Mucous membranes are moist.     Pharynx: Oropharynx is clear. No oropharyngeal exudate or posterior oropharyngeal erythema.     Comments: Pustules on R side of mouth in moustache line Eyes:     General: No scleral icterus.       Right eye: No discharge.        Left eye: No discharge.     Extraocular Movements: Extraocular movements intact.     Conjunctiva/sclera: Conjunctivae normal.     Pupils: Pupils are equal, round, and reactive to light.  Neck:     Vascular: No carotid bruit.  Cardiovascular:     Rate and Rhythm: Normal rate and regular rhythm.     Pulses: Normal pulses.     Heart sounds: No murmur. No friction rub. No gallop.   Pulmonary:     Effort: Pulmonary effort is normal. No respiratory distress.     Breath sounds: Normal breath sounds. No stridor. No wheezing, rhonchi or rales.  Chest:     Chest wall: No tenderness.  Abdominal:     General: Abdomen is flat. Bowel sounds are normal. There is no distension.     Palpations: Abdomen is soft. There is no mass.     Tenderness: There is no abdominal tenderness. There is no right CVA tenderness, left CVA tenderness, guarding or rebound.     Hernia: No hernia is present.  Genitourinary:    Comments: Genital exam deferred with shared decision making Musculoskeletal:        General: No swelling, tenderness, deformity or signs of injury.     Cervical back: Normal range of motion and neck supple. No rigidity. No muscular tenderness.     Right lower  leg: No edema.     Left lower leg: No edema.  Lymphadenopathy:     Cervical: No cervical adenopathy.  Skin:    General: Skin is warm and dry.     Capillary Refill: Capillary refill takes less than 2 seconds.     Coloration: Skin is not jaundiced or pale.     Findings: No bruising, erythema, lesion or rash.   Neurological:     General: No focal deficit present.     Mental Status: He is alert and oriented to person, place, and time.     Cranial Nerves: No cranial nerve deficit.     Sensory: No sensory deficit.     Motor: No weakness.     Coordination: Coordination normal.     Gait: Gait normal.     Deep Tendon Reflexes: Reflexes normal.  Psychiatric:        Mood and Affect: Mood normal.        Behavior: Behavior normal.        Thought Content: Thought content normal.        Judgment: Judgment normal.     Results for orders placed or performed in visit on 05/08/18  Bayer DCA Hb A1c Waived  Result Value Ref Range   HB A1C (BAYER DCA - WAIVED) 6.1 <7.0 %  CBC with Differential/Platelet  Result Value Ref Range   WBC 7.9 3.4 - 10.8 x10E3/uL   RBC 5.51 4.14 - 5.80 x10E6/uL   Hemoglobin 14.3 13.0 - 17.7 g/dL   Hematocrit 43.0 37.5 - 51.0 %   MCV 78 (L) 79 - 97 fL   MCH 26.0 (L) 26.6 - 33.0 pg   MCHC 33.3 31.5 - 35.7 g/dL   RDW 14.1 12.3 - 15.4 %   Platelets 293 150 - 450 x10E3/uL   Neutrophils 67 Not Estab. %   Lymphs 22 Not Estab. %   Monocytes 7 Not Estab. %   Eos 3 Not Estab. %   Basos 1 Not Estab. %   Neutrophils Absolute 5.3 1.4 - 7.0 x10E3/uL   Lymphocytes Absolute 1.7 0.7 - 3.1 x10E3/uL   Monocytes Absolute 0.5 0.1 - 0.9 x10E3/uL   EOS (ABSOLUTE) 0.2 0.0 - 0.4 x10E3/uL   Basophils Absolute 0.0 0.0 - 0.2 x10E3/uL   Immature Granulocytes 0 Not Estab. %   Immature Grans (Abs) 0.0 0.0 - 0.1 x10E3/uL  Comprehensive metabolic panel  Result Value Ref Range   Glucose 93 65 - 99 mg/dL   BUN 13 6 - 24 mg/dL   Creatinine, Ser 0.89 0.76 - 1.27 mg/dL   GFR calc non Af Amer 102 >59 mL/min/1.73   GFR calc Af Amer 118 >59 mL/min/1.73   BUN/Creatinine Ratio 15 9 - 20   Sodium 135 134 - 144 mmol/L   Potassium 4.1 3.5 - 5.2 mmol/L   Chloride 98 96 - 106 mmol/L   CO2 22 20 - 29 mmol/L   Calcium 9.4 8.7 - 10.2 mg/dL   Total Protein 7.7 6.0 - 8.5 g/dL   Albumin 4.3 3.5 - 5.5 g/dL    Globulin, Total 3.4 1.5 - 4.5 g/dL   Albumin/Globulin Ratio 1.3 1.2 - 2.2   Bilirubin Total 0.3 0.0 - 1.2 mg/dL   Alkaline Phosphatase 71 39 - 117 IU/L   AST 19 0 - 40 IU/L   ALT 23 0 - 44 IU/L  Lipid panel  Result Value Ref Range   Cholesterol, Total 153 100 - 199 mg/dL   Triglycerides 105 0 -  149 mg/dL   HDL 42 >39 mg/dL   VLDL Cholesterol Cal 21 5 - 40 mg/dL   LDL Calculated 90 0 - 99 mg/dL   Chol/HDL Ratio 3.6 0.0 - 5.0 ratio  PSA  Result Value Ref Range   Prostate Specific Ag, Serum 0.5 0.0 - 4.0 ng/mL  TSH  Result Value Ref Range   TSH 1.290 0.450 - 4.500 uIU/mL  Urinalysis, Routine w reflex microscopic  Result Value Ref Range   Specific Gravity, UA 1.020 1.005 - 1.030   pH, UA 5.5 5.0 - 7.5   Color, UA Yellow Yellow   Appearance Ur Clear Clear   Leukocytes, UA Negative Negative   Protein, UA Negative Negative/Trace   Glucose, UA Negative Negative   Ketones, UA Negative Negative   RBC, UA Negative Negative   Bilirubin, UA Negative Negative   Urobilinogen, Ur 0.2 0.2 - 1.0 mg/dL   Nitrite, UA Negative Negative  Homocysteine  Result Value Ref Range   Homocysteine 10.5 0.0 - 15.0 umol/L      Assessment & Plan:   Problem List Items Addressed This Visit      Cardiovascular and Mediastinum   Hypertension    Under good control on current regimen. Continue current regimen. Continue to monitor. Call with any concerns. Refills given. Las drawn today.       Relevant Medications   atenolol (TENORMIN) 100 MG tablet   benazepril (LOTENSIN) 10 MG tablet   Other Relevant Orders   Comprehensive metabolic panel   Microalbumin, Urine Waived   Homocysteine     Digestive   GERD (gastroesophageal reflux disease)    Under good control on current regimen. Continue current regimen. Continue to monitor. Call with any concerns. Refills given. Las drawn today.       Relevant Medications   esomeprazole (NEXIUM) 40 MG capsule   Other Relevant Orders   CBC with  Differential/Platelet   Comprehensive metabolic panel     Other   Anxiety    Stable. Using klonopin very occasionally. Still has a refill. Refill given. Discussed adding wellbutrin to try to keep him under better control. He will consider and let us know if he'd like to do that. Continue to monitor.       Relevant Medications   DULoxetine (CYMBALTA) 60 MG capsule   Other Relevant Orders   Comprehensive metabolic panel   TSH   Hyperlipidemia    Rechecking levels today. Await results. Treat as needed.       Relevant Medications   atenolol (TENORMIN) 100 MG tablet   benazepril (LOTENSIN) 10 MG tablet   Other Relevant Orders   Comprehensive metabolic panel   Lipid Panel w/o Chol/HDL Ratio   Depression    Stable. Using klonopin very occasionally. Still has a refill. Refill given. Discussed adding wellbutrin to try to keep him under better control. He will consider and let us know if he'd like to do that. Continue to monitor.       Relevant Medications   clonazePAM (KLONOPIN) 1 MG tablet   DULoxetine (CYMBALTA) 60 MG capsule   Other Relevant Orders   Comprehensive metabolic panel   TSH   Prediabetes    Stable with A1c of 5.9. Continue diet and exercise. Call with any concerns. Continue to monitor.       Relevant Orders   Bayer DCA Hb A1c Waived   Comprehensive metabolic panel   TSH   UA/M w/rflx Culture, Routine   CBC With Differential/Platelet  Other Visit Diagnoses    Routine general medical examination at a health care facility    -  Primary   Vaccines up to date. Screening labs checked today. Continue diet and exercise. Call with any concerns. Continue to monitor.    Relevant Orders   PSA   CBC With Differential/Platelet   Folliculitis       Will treat with bactroban. Call with any concerns or if not getting better.    Morbid obesity ([place])       Due to HTN, IFG and HLD. Will work on diet and exercise with goal of losing 1-2lbs per week.    BMI 39.0-39.9,adult        Will work on diet and exercise with goal of losing 1-2lbs per week.      LABORATORY TESTING:  Health maintenance labs ordered today as discussed above.   The natural history of prostate cancer and ongoing controversy regarding screening and potential treatment outcomes of prostate cancer has been discussed with the patient. The meaning of a false positive PSA and a false negative PSA has been discussed. He indicates understanding of the limitations of this screening test and wishes to proceed with screening PSA testing.   IMMUNIZATIONS:   - Tdap: Tetanus vaccination status reviewed: last tetanus booster within 10 years. - Influenza: Up to date  PATIENT COUNSELING:    Sexuality: Discussed sexually transmitted diseases, partner selection, use of condoms, avoidance of unintended pregnancy  and contraceptive alternatives.   Advised to avoid cigarette smoking.  I discussed with the patient that most people either abstain from alcohol or drink within safe limits (<=14/week and <=4 drinks/occasion for males, <=7/weeks and <= 3 drinks/occasion for females) and that the risk for alcohol disorders and other health effects rises proportionally with the number of drinks per week and how often a drinker exceeds daily limits.  Discussed cessation/primary prevention of drug use and availability of treatment for abuse.   Diet: Encouraged to adjust caloric intake to maintain  or achieve ideal body weight, to reduce intake of dietary saturated fat and total fat, to limit sodium intake by avoiding high sodium foods and not adding table salt, and to maintain adequate dietary potassium and calcium preferably from fresh fruits, vegetables, and low-fat dairy products.    stressed the importance of regular exercise  Injury prevention: Discussed safety belts, safety helmets, smoke detector, smoking near bedding or upholstery.   Dental health: Discussed importance of regular tooth brushing, flossing, and  dental visits.   Follow up plan: NEXT PREVENTATIVE PHYSICAL DUE IN 1 YEAR. Return in about 6 months (around 12/16/2019).

## 2019-06-18 NOTE — Assessment & Plan Note (Signed)
Under good control on current regimen. Continue current regimen. Continue to monitor. Call with any concerns. Refills given. Las drawn today.   

## 2019-06-18 NOTE — Patient Instructions (Signed)
Health Maintenance, Male Adopting a healthy lifestyle and getting preventive care are important in promoting health and wellness. Ask your health care provider about:  The right schedule for you to have regular tests and exams.  Things you can do on your own to prevent diseases and keep yourself healthy. What should I know about diet, weight, and exercise? Eat a healthy diet   Eat a diet that includes plenty of vegetables, fruits, low-fat dairy products, and lean protein.  Do not eat a lot of foods that are high in solid fats, added sugars, or sodium. Maintain a healthy weight Body mass index (BMI) is a measurement that can be used to identify possible weight problems. It estimates body fat based on height and weight. Your health care provider can help determine your BMI and help you achieve or maintain a healthy weight. Get regular exercise Get regular exercise. This is one of the most important things you can do for your health. Most adults should:  Exercise for at least 150 minutes each week. The exercise should increase your heart rate and make you sweat (moderate-intensity exercise).  Do strengthening exercises at least twice a week. This is in addition to the moderate-intensity exercise.  Spend less time sitting. Even light physical activity can be beneficial. Watch cholesterol and blood lipids Have your blood tested for lipids and cholesterol at 49 years of age, then have this test every 5 years. You may need to have your cholesterol levels checked more often if:  Your lipid or cholesterol levels are high.  You are older than 49 years of age.  You are at high risk for heart disease. What should I know about cancer screening? Many types of cancers can be detected early and may often be prevented. Depending on your health history and family history, you may need to have cancer screening at various ages. This may include screening for:  Colorectal cancer.  Prostate  cancer.  Skin cancer.  Lung cancer. What should I know about heart disease, diabetes, and high blood pressure? Blood pressure and heart disease  High blood pressure causes heart disease and increases the risk of stroke. This is more likely to develop in people who have high blood pressure readings, are of African descent, or are overweight.  Talk with your health care provider about your target blood pressure readings.  Have your blood pressure checked: ? Every 3-5 years if you are 18-39 years of age. ? Every year if you are 40 years old or older.  If you are between the ages of 65 and 75 and are a current or former smoker, ask your health care provider if you should have a one-time screening for abdominal aortic aneurysm (AAA). Diabetes Have regular diabetes screenings. This checks your fasting blood sugar level. Have the screening done:  Once every three years after age 45 if you are at a normal weight and have a low risk for diabetes.  More often and at a younger age if you are overweight or have a high risk for diabetes. What should I know about preventing infection? Hepatitis B If you have a higher risk for hepatitis B, you should be screened for this virus. Talk with your health care provider to find out if you are at risk for hepatitis B infection. Hepatitis C Blood testing is recommended for:  Everyone born from 1945 through 1965.  Anyone with known risk factors for hepatitis C. Sexually transmitted infections (STIs)  You should be screened each year   for STIs, including gonorrhea and chlamydia, if: ? You are sexually active and are younger than 49 years of age. ? You are older than 49 years of age and your health care provider tells you that you are at risk for this type of infection. ? Your sexual activity has changed since you were last screened, and you are at increased risk for chlamydia or gonorrhea. Ask your health care provider if you are at risk.  Ask your  health care provider about whether you are at high risk for HIV. Your health care provider may recommend a prescription medicine to help prevent HIV infection. If you choose to take medicine to prevent HIV, you should first get tested for HIV. You should then be tested every 3 months for as long as you are taking the medicine. Follow these instructions at home: Lifestyle  Do not use any products that contain nicotine or tobacco, such as cigarettes, e-cigarettes, and chewing tobacco. If you need help quitting, ask your health care provider.  Do not use street drugs.  Do not share needles.  Ask your health care provider for help if you need support or information about quitting drugs. Alcohol use  Do not drink alcohol if your health care provider tells you not to drink.  If you drink alcohol: ? Limit how much you have to 0-2 drinks a day. ? Be aware of how much alcohol is in your drink. In the [place] one drink equals one 12 oz bottle of beer (355 mL), one 5 oz glass of wine (148 mL), or one 1 oz glass of hard liquor (44 mL). General instructions  Schedule regular health, dental, and eye exams.  Stay current with your vaccines.  Tell your health care provider if: ? You often feel depressed. ? You have ever been abused or do not feel safe at home. Summary  Adopting a healthy lifestyle and getting preventive care are important in promoting health and wellness.  Follow your health care provider's instructions about healthy diet, exercising, and getting tested or screened for diseases.  Follow your health care provider's instructions on monitoring your cholesterol and blood pressure. This information is not intended to replace advice given to you by your health care provider. Make sure you discuss any questions you have with your health care provider. Document Revised: 05/03/2018 Document Reviewed: 05/03/2018 Elsevier Patient Education  [place]. Bupropion tablets  (Depression/Mood Disorders) What is this medicine? BUPROPION (byoo PROE pee on) is used to treat depression. This medicine may be used for other purposes; ask your health care provider or pharmacist if you have questions. COMMON BRAND NAME(S): Wellbutrin What should I tell my health care provider before I take this medicine? They need to know if you have any of these conditions:  an eating disorder, such as anorexia or bulimia  bipolar disorder or psychosis  diabetes or high blood sugar, treated with medication  glaucoma  heart disease, previous heart attack, or irregular heart beat  head injury or brain tumor  high blood pressure  kidney or liver disease  seizures  suicidal thoughts or a previous suicide attempt  Tourette's syndrome  weight loss  an unusual or allergic reaction to bupropion, other medicines, foods, dyes, or preservatives  breast-feeding  pregnant or trying to become pregnant How should I use this medicine? Take this medicine by mouth with a glass of water. Follow the directions on the prescription label. You can take it with or without food. If it upsets your  stomach, take it with food. Take your medicine at regular intervals. Do not take your medicine more often than directed. Do not stop taking this medicine suddenly except upon the advice of your doctor. Stopping this medicine too quickly may cause serious side effects or your condition may worsen. A special MedGuide will be given to you by the pharmacist with each prescription and refill. Be sure to read this information carefully each time. Talk to your pediatrician regarding the use of this medicine in children. Special care may be needed. Overdosage: If you think you have taken too much of this medicine contact a poison control center or emergency room at once. NOTE: This medicine is only for you. Do not share this medicine with others. What if I miss a dose? If you miss a dose, take it as soon as  you can. If it is less than four hours to your next dose, take only that dose and skip the missed dose. Do not take double or extra doses. What may interact with this medicine? Do not take this medicine with any of the following medications:  linezolid  MAOIs like Azilect, Carbex, Eldepryl, Marplan, Nardil, and Parnate  methylene blue (injected into a vein)  other medicines that contain bupropion like Zyban This medicine may also interact with the following medications:  alcohol  certain medicines for anxiety or sleep  certain medicines for blood pressure like metoprolol, propranolol  certain medicines for depression or psychotic disturbances  certain medicines for HIV or AIDS like efavirenz, lopinavir, nelfinavir, ritonavir  certain medicines for irregular heart beat like propafenone, flecainide  certain medicines for Parkinson's disease like amantadine, levodopa  certain medicines for seizures like carbamazepine, phenytoin, phenobarbital  cimetidine  clopidogrel  cyclophosphamide  digoxin  furazolidone  isoniazid  nicotine  orphenadrine  procarbazine  steroid medicines like prednisone or cortisone  stimulant medicines for attention disorders, weight loss, or to stay awake  tamoxifen  theophylline  thiotepa  ticlopidine  tramadol  warfarin This list may not describe all possible interactions. Give your health care provider a list of all the medicines, herbs, non-prescription drugs, or dietary supplements you use. Also tell them if you smoke, drink alcohol, or use illegal drugs. Some items may interact with your medicine. What should I watch for while using this medicine? Tell your doctor if your symptoms do not get better or if they get worse. Visit your doctor or healthcare provider for regular checks on your progress. Because it may take several weeks to see the full effects of this medicine, it is important to continue your treatment as prescribed  by your doctor. This medicine may cause serious skin reactions. They can happen weeks to months after starting the medicine. Contact your healthcare provider right away if you notice fevers or flu-like symptoms with a rash. The rash may be red or purple and then turn into blisters or peeling of the skin. Or, you might notice a red rash with swelling of the face, lips or lymph nodes in your neck or under your arms. Patients and their families should watch out for new or worsening thoughts of suicide or depression. Also watch out for sudden changes in feelings such as feeling anxious, agitated, panicky, irritable, hostile, aggressive, impulsive, severely restless, overly excited and hyperactive, or not being able to sleep. If this happens, especially at the beginning of treatment or after a change in dose, call your healthcare provider. Avoid alcoholic drinks while taking this medicine. Drinking excessive alcoholic beverages, using  sleeping or anxiety medicines, or quickly stopping the use of these agents while taking this medicine may increase your risk for a seizure. Do not drive or use heavy machinery until you know how this medicine affects you. This medicine can impair your ability to perform these tasks. Do not take this medicine close to bedtime. It may prevent you from sleeping. Your mouth may get dry. Chewing sugarless gum or sucking hard candy, and drinking plenty of water may help. Contact your doctor if the problem does not go away or is severe. What side effects may I notice from receiving this medicine? Side effects that you should report to your doctor or health care professional as soon as possible:  allergic reactions like skin rash, itching or hives, swelling of the face, lips, or tongue  breathing problems  changes in vision  confusion  elevated mood, decreased need for sleep, racing thoughts, impulsive behavior  fast or irregular heartbeat  hallucinations, loss of contact with  reality  increased blood pressure  rash, fever, and swollen lymph nodes  redness, blistering, peeling, or loosening of the skin, including inside the mouth  seizures  suicidal thoughts or other mood changes  unusually weak or tired  vomiting Side effects that usually do not require medical attention (report to your doctor or health care professional if they continue or are bothersome):  constipation  headache  loss of appetite  nausea  tremors  weight loss This list may not describe all possible side effects. Call your doctor for medical advice about side effects. You may report side effects to FDA at 1-800-FDA-1088. Where should I keep my medicine? Keep out of the reach of children. Store at room temperature between 20 and 25 degrees C (68 and 77 degrees F), away from direct sunlight and moisture. Keep tightly closed. Throw away any unused medicine after the expiration date. NOTE: This sheet is a summary. It may not cover all possible information. If you have questions about this medicine, talk to your doctor, pharmacist, or health care provider.  2020 Elsevier/Gold Standard (2018-08-03 14:02:47)

## 2019-06-19 LAB — TSH: TSH: 0.916 u[IU]/mL (ref 0.450–4.500)

## 2019-06-19 LAB — COMPREHENSIVE METABOLIC PANEL
ALT: 24 IU/L (ref 0–44)
AST: 16 IU/L (ref 0–40)
Albumin/Globulin Ratio: 1.6 (ref 1.2–2.2)
Albumin: 4.5 g/dL (ref 4.0–5.0)
Alkaline Phosphatase: 74 IU/L (ref 39–117)
BUN/Creatinine Ratio: 15 (ref 9–20)
BUN: 15 mg/dL (ref 6–24)
Bilirubin Total: 0.3 mg/dL (ref 0.0–1.2)
CO2: 24 mmol/L (ref 20–29)
Calcium: 9.2 mg/dL (ref 8.7–10.2)
Chloride: 100 mmol/L (ref 96–106)
Creatinine, Ser: 0.98 mg/dL (ref 0.76–1.27)
GFR calc Af Amer: 105 mL/min/{1.73_m2} (ref >59)
GFR calc non Af Amer: 91 mL/min/{1.73_m2} (ref >59)
Globulin, Total: 2.9 g/dL (ref 1.5–4.5)
Glucose: 89 mg/dL (ref 65–99)
Potassium: 3.9 mmol/L (ref 3.5–5.2)
Sodium: 139 mmol/L (ref 134–144)
Total Protein: 7.4 g/dL (ref 6.0–8.5)

## 2019-06-19 LAB — PSA: Prostate Specific Ag, Serum: 0.6 ng/mL (ref 0.0–4.0)

## 2019-06-19 LAB — LIPID PANEL W/O CHOL/HDL RATIO
Cholesterol, Total: 169 mg/dL (ref 100–199)
HDL: 44 mg/dL (ref >39)
LDL Chol Calc (NIH): 104 mg/dL — ABNORMAL HIGH (ref 0–99)
Triglycerides: 119 mg/dL (ref 0–149)
VLDL Cholesterol Cal: 21 mg/dL (ref 5–40)

## 2019-06-19 LAB — HOMOCYSTEINE: Homocysteine: 9.7 umol/L (ref 0.0–14.5)

## 2019-12-17 ENCOUNTER — Ambulatory Visit (INDEPENDENT_AMBULATORY_CARE_PROVIDER_SITE_OTHER): Payer: 59 | Admitting: Family Medicine

## 2019-12-17 ENCOUNTER — Other Ambulatory Visit: Payer: Self-pay

## 2019-12-17 ENCOUNTER — Encounter: Payer: Self-pay | Admitting: Family Medicine

## 2019-12-17 VITALS — BP 126/76 | HR 72 | Temp 98.8°F | Wt 281.2 lb

## 2019-12-17 DIAGNOSIS — F331 Major depressive disorder, recurrent, moderate: Secondary | ICD-10-CM | POA: Diagnosis not present

## 2019-12-17 DIAGNOSIS — I10 Essential (primary) hypertension: Secondary | ICD-10-CM

## 2019-12-17 DIAGNOSIS — K219 Gastro-esophageal reflux disease without esophagitis: Secondary | ICD-10-CM

## 2019-12-17 DIAGNOSIS — F419 Anxiety disorder, unspecified: Secondary | ICD-10-CM

## 2019-12-17 DIAGNOSIS — F329 Major depressive disorder, single episode, unspecified: Secondary | ICD-10-CM

## 2019-12-17 DIAGNOSIS — Z1159 Encounter for screening for other viral diseases: Secondary | ICD-10-CM

## 2019-12-17 DIAGNOSIS — E785 Hyperlipidemia, unspecified: Secondary | ICD-10-CM

## 2019-12-17 DIAGNOSIS — F32A Depression, unspecified: Secondary | ICD-10-CM

## 2019-12-17 DIAGNOSIS — R7303 Prediabetes: Secondary | ICD-10-CM

## 2019-12-17 LAB — BAYER DCA HB A1C WAIVED: HB A1C (BAYER DCA - WAIVED): 6.6 % (ref <7.0)

## 2019-12-17 MED ORDER — ATENOLOL 100 MG PO TABS: 100.0000 mg | ORAL_TABLET | Freq: Every day | ORAL | 1 refills | Status: DC

## 2019-12-17 MED ORDER — BENAZEPRIL HCL 10 MG PO TABS: 10.0000 mg | ORAL_TABLET | Freq: Every day | ORAL | 1 refills | Status: DC

## 2019-12-17 MED ORDER — CLONAZEPAM 1 MG PO TABS: 1.0000 mg | ORAL_TABLET | Freq: Every day | ORAL | 1 refills | Status: DC | PRN

## 2019-12-17 MED ORDER — DULOXETINE HCL 60 MG PO CPEP: 60.0000 mg | ORAL_CAPSULE | Freq: Every day | ORAL | 1 refills | Status: DC

## 2019-12-17 MED ORDER — ESOMEPRAZOLE MAGNESIUM 40 MG PO CPDR: 40.0000 mg | DELAYED_RELEASE_CAPSULE | Freq: Two times a day (BID) | ORAL | 1 refills | Status: DC

## 2019-12-17 NOTE — Progress Notes (Signed)
BP 126/76 (BP Location: Left Arm, Cuff Size: Normal)   Pulse 72   Temp 98.8 F (37.1 C) (Oral)   Wt (!) 281 lb 3.2 oz (127.6 kg)   SpO2 96%   BMI 40.35 kg/m    Subjective:    Patient ID: Aaron Robinson, male    DOB: 01-19-1971, 49 y.o.   MRN: [id number]  HPI: Aaron Robinson is a 49 y.o. male  Chief Complaint  Patient presents with  . Hypertension  . Hyperlipidemia  . Depression  . Gastroesophageal Reflux   HYPERTENSION / [place] Satisfied with current treatment? yes Duration of hypertension: chronic BP monitoring frequency: not checking BP medication side effects: no Past BP meds: benazepril, atenolol Duration of hyperlipidemia: chronic Cholesterol medication side effects: not on anything Cholesterol supplements: none Past cholesterol medications: none Medication compliance: excellent compliance Aspirin: no Recent stressors: no Recurrent headaches: no Visual changes: no Palpitations: no Dyspnea: no Chest pain: no Lower extremity edema: no Dizzy/lightheaded: no\  Impaired Fasting Glucose HbA1C:  Lab Results  Component Value Date   HGBA1C 5.9 06/18/2019   Duration of elevated blood sugar:  Polydipsia: no Polyuria: no Weight change: no Visual disturbance: no Glucose Monitoring: no Diabetic Education: Not Completed Family history of diabetes: yes  ANXIETY/STRESS Duration: chronic Status:controlled Anxious mood: no  Excessive worrying: yes Irritability: no  Sweating: no Nausea: no Palpitations:no Hyperventilation: no Panic attacks: no Agoraphobia: no  Obscessions/compulsions: no Depressed mood: no Depression screen [facility] 2/9 12/17/2019 06/18/2019 05/16/2019 04/16/2019 12/08/2017  Decreased Interest 1 2 2 2  0  Down, Depressed, Hopeless 0 1 3 0 0  PHQ - 2 Score 1 3 5 2  0  Altered sleeping 1 2 2 3  -  Tired, decreased energy 1 1 2 2  -  Change in appetite 0 0 1 1 -  Feeling bad or failure about yourself  0 0 1 1 -  Trouble  concentrating 0 2 2 2  -  Moving slowly or fidgety/restless 0 0 1 0 -  Suicidal thoughts 0 0 0 0 -  PHQ-9 Score 3 8 14 11  -  Difficult doing work/chores - Somewhat difficult Somewhat difficult - -   Anhedonia: no Weight changes: no Insomnia: no   Hypersomnia: no Fatigue/loss of energy: no Feelings of worthlessness: no Feelings of guilt: no Impaired concentration/indecisiveness: no Suicidal ideations: no  Crying spells: no Recent Stressors/Life Changes: no   Relationship problems: no   Family stress: no     Financial stress: no    Job stress: no    Recent death/loss: no  GERD GERD control status: controlled  Satisfied with current treatment? yes Heartburn frequency:  Medication side effects: no  Medication compliance: excellent Dysphagia: no Odynophagia:  no Hematemesis: no Blood in stool: no EGD: no   Relevant past medical, surgical, family and social history reviewed and updated as indicated. Interim medical history since our last visit reviewed. Allergies and medications reviewed and updated.  Review of Systems  Constitutional: Negative.   Respiratory: Negative.   Cardiovascular: Negative.   Gastrointestinal: Negative.   Musculoskeletal: Negative.   Neurological: Negative.   Psychiatric/Behavioral: Negative.     Per HPI unless specifically indicated above     Objective:    BP 126/76 (BP Location: Left Arm, Cuff Size: Normal)   Pulse 72   Temp 98.8 F (37.1 C) (Oral)   Wt (!) 281 lb 3.2 oz (127.6 kg)   SpO2 96%   BMI 40.35 kg/m   Wt Readings from Last 3  Encounters:  12/17/19 (!) 281 lb 3.2 oz (127.6 kg)  06/18/19 275 lb (124.7 kg)  04/16/19 279 lb (126.6 kg)    Physical Exam Vitals and nursing note reviewed.  Constitutional:      General: He is not in acute distress.    Appearance: Normal appearance. He is not ill-appearing, toxic-appearing or diaphoretic.  HENT:     Head: Normocephalic and atraumatic.     Right Ear: External ear normal.      Left Ear: External ear normal.     Nose: Nose normal.     Mouth/Throat:     Mouth: Mucous membranes are moist.     Pharynx: Oropharynx is clear.  Eyes:     General: No scleral icterus.       Right eye: No discharge.        Left eye: No discharge.     Extraocular Movements: Extraocular movements intact.     Conjunctiva/sclera: Conjunctivae normal.     Pupils: Pupils are equal, round, and reactive to light.  Cardiovascular:     Rate and Rhythm: Normal rate and regular rhythm.     Pulses: Normal pulses.     Heart sounds: Normal heart sounds. No murmur heard.  No friction rub. No gallop.   Pulmonary:     Effort: Pulmonary effort is normal. No respiratory distress.     Breath sounds: Normal breath sounds. No stridor. No wheezing, rhonchi or rales.  Chest:     Chest wall: No tenderness.  Musculoskeletal:        General: Normal range of motion.     Cervical back: Normal range of motion and neck supple.  Skin:    General: Skin is warm and dry.     Capillary Refill: Capillary refill takes less than 2 seconds.     Coloration: Skin is not jaundiced or pale.     Findings: No bruising, erythema, lesion or rash.  Neurological:     General: No focal deficit present.     Mental Status: He is alert and oriented to person, place, and time. Mental status is at baseline.  Psychiatric:        Mood and Affect: Mood normal.        Behavior: Behavior normal.        Thought Content: Thought content normal.        Judgment: Judgment normal.     Results for orders placed or performed in visit on 06/18/19  Comprehensive metabolic panel  Result Value Ref Range   Glucose 89 65 - 99 mg/dL   BUN 15 6 - 24 mg/dL   Creatinine, Ser 0.98 0.76 - 1.27 mg/dL   GFR calc non Af Amer 91 >59 mL/min/1.73   GFR calc Af Amer 105 >59 mL/min/1.73   BUN/Creatinine Ratio 15 9 - 20   Sodium 139 134 - 144 mmol/L   Potassium 3.9 3.5 - 5.2 mmol/L   Chloride 100 96 - 106 mmol/L   CO2 24 20 - 29 mmol/L   Calcium 9.2  8.7 - 10.2 mg/dL   Total Protein 7.4 6.0 - 8.5 g/dL   Albumin 4.5 4.0 - 5.0 g/dL   Globulin, Total 2.9 1.5 - 4.5 g/dL   Albumin/Globulin Ratio 1.6 1.2 - 2.2   Bilirubin Total 0.3 0.0 - 1.2 mg/dL   Alkaline Phosphatase 74 39 - 117 IU/L   AST 16 0 - 40 IU/L   ALT 24 0 - 44 IU/L  Lipid Panel w/o Chol/HDL Ratio  Result  Value Ref Range   Cholesterol, Total 169 100 - 199 mg/dL   Triglycerides 119 0 - 149 mg/dL   HDL 44 >39 mg/dL   VLDL Cholesterol Cal 21 5 - 40 mg/dL   LDL Chol Calc (NIH) 104 (H) 0 - 99 mg/dL  TSH  Result Value Ref Range   TSH 0.916 0.450 - 4.500 uIU/mL  PSA  Result Value Ref Range   Prostate Specific Ag, Serum 0.6 0.0 - 4.0 ng/mL  Homocysteine  Result Value Ref Range   Homocysteine 9.7 0.0 - 14.5 umol/L      Assessment & Plan:   Problem List Items Addressed This Visit      Cardiovascular and Mediastinum   Hypertension - Primary    Under good control on current regimen. Continue current regimen. Continue to monitor. Call with any concerns. Refills given. Labs drawn today.       Relevant Medications   benazepril (LOTENSIN) 10 MG tablet   atenolol (TENORMIN) 100 MG tablet   Other Relevant Orders   CBC with Differential/Platelet   Comprehensive metabolic panel     Digestive   GERD (gastroesophageal reflux disease)    Under good control on current regimen. Continue current regimen. Continue to monitor. Call with any concerns. Refills given. Labs drawn today.       Relevant Medications   esomeprazole (NEXIUM) 40 MG capsule   Other Relevant Orders   CBC with Differential/Platelet   Comprehensive metabolic panel     Other   Anxiety    Under good control on current regimen. Continue current regimen. Continue to monitor. Call with any concerns. Refills given. Clonazepam Rx should last at least 6 months.  Labs drawn today.       Relevant Medications   DULoxetine (CYMBALTA) 60 MG capsule   Hyperlipidemia    Rechecking labs today. Await results. Treat  as needed.       Relevant Medications   benazepril (LOTENSIN) 10 MG tablet   atenolol (TENORMIN) 100 MG tablet   Other Relevant Orders   CBC with Differential/Platelet   Comprehensive metabolic panel   Lipid Panel w/o Chol/HDL Ratio   Depression    Under good control on current regimen. Continue current regimen. Continue to monitor. Call with any concerns. Refills given. Labs drawn today.       Relevant Medications   DULoxetine (CYMBALTA) 60 MG capsule   clonazePAM (KLONOPIN) 1 MG tablet   Other Relevant Orders   CBC with Differential/Platelet   Comprehensive metabolic panel   Prediabetes    Checking labs today. Await results. Treat as needed. Call with any concerns.       Relevant Orders   Bayer DCA Hb A1c Waived   CBC with Differential/Platelet   Comprehensive metabolic panel    Other Visit Diagnoses    Need for hepatitis C screening test       Relevant Orders   Hepatitis C Antibody       Follow up plan: Return in about 6 months (around 06/18/2020) for Physical.

## 2019-12-17 NOTE — Assessment & Plan Note (Signed)
Under good control on current regimen. Continue current regimen. Continue to monitor. Call with any concerns. Refills given. Labs drawn today.   

## 2019-12-17 NOTE — Assessment & Plan Note (Signed)
Checking labs today. Await results. Treat as needed. Call with any concerns.

## 2019-12-17 NOTE — Assessment & Plan Note (Signed)
Rechecking labs today. Await results. Treat as needed.  °

## 2019-12-17 NOTE — Assessment & Plan Note (Signed)
Under good control on current regimen. Continue current regimen. Continue to monitor. Call with any concerns. Refills given. Clonazepam Rx should last at least 6 months.  Labs drawn today.

## 2019-12-18 LAB — CBC WITH DIFFERENTIAL/PLATELET
Basophils Absolute: 0.1 10*3/uL (ref 0.0–0.2)
Basos: 1 %
EOS (ABSOLUTE): 0.1 10*3/uL (ref 0.0–0.4)
Eos: 1 %
Hematocrit: 41.7 % (ref 37.5–51.0)
Hemoglobin: 13.6 g/dL (ref 13.0–17.7)
Immature Grans (Abs): 0 10*3/uL (ref 0.0–0.1)
Immature Granulocytes: 0 %
Lymphocytes Absolute: 2.3 10*3/uL (ref 0.7–3.1)
Lymphs: 27 %
MCH: 29.2 pg (ref 26.6–33.0)
MCHC: 32.6 g/dL (ref 31.5–35.7)
MCV: 90 fL (ref 79–97)
Monocytes Absolute: 0.6 10*3/uL (ref 0.1–0.9)
Monocytes: 7 %
Neutrophils Absolute: 5.5 10*3/uL (ref 1.4–7.0)
Neutrophils: 64 %
Platelets: 200 10*3/uL (ref 150–450)
RBC: 4.66 x10E6/uL (ref 4.14–5.80)
RDW: 13.2 % (ref 11.6–15.4)
WBC: 8.5 10*3/uL (ref 3.4–10.8)

## 2019-12-18 LAB — COMPREHENSIVE METABOLIC PANEL
ALT: 26 IU/L (ref 0–44)
AST: 19 IU/L (ref 0–40)
Albumin/Globulin Ratio: 1.3 (ref 1.2–2.2)
Albumin: 4.3 g/dL (ref 4.0–5.0)
Alkaline Phosphatase: 78 IU/L (ref 48–121)
BUN/Creatinine Ratio: 14 (ref 9–20)
BUN: 12 mg/dL (ref 6–24)
Bilirubin Total: 0.3 mg/dL (ref 0.0–1.2)
CO2: 26 mmol/L (ref 20–29)
Calcium: 9.5 mg/dL (ref 8.7–10.2)
Chloride: 97 mmol/L (ref 96–106)
Creatinine, Ser: 0.87 mg/dL (ref 0.76–1.27)
GFR calc Af Amer: 117 mL/min/{1.73_m2} (ref >59)
GFR calc non Af Amer: 101 mL/min/{1.73_m2} (ref >59)
Globulin, Total: 3.2 g/dL (ref 1.5–4.5)
Glucose: 136 mg/dL — ABNORMAL HIGH (ref 65–99)
Potassium: 5 mmol/L (ref 3.5–5.2)
Sodium: 136 mmol/L (ref 134–144)
Total Protein: 7.5 g/dL (ref 6.0–8.5)

## 2019-12-18 LAB — LIPID PANEL W/O CHOL/HDL RATIO
Cholesterol, Total: 164 mg/dL (ref 100–199)
HDL: 40 mg/dL (ref >39)
LDL Chol Calc (NIH): 95 mg/dL (ref 0–99)
Triglycerides: 165 mg/dL — ABNORMAL HIGH (ref 0–149)
VLDL Cholesterol Cal: 29 mg/dL (ref 5–40)

## 2019-12-18 LAB — HEPATITIS C ANTIBODY: Hep C Virus Ab: <0.1 s/co ratio (ref 0.0–0.9)

## 2019-12-28 ENCOUNTER — Other Ambulatory Visit: Payer: Self-pay | Admitting: Nurse Practitioner

## 2019-12-28 ENCOUNTER — Encounter: Payer: Self-pay | Admitting: Family Medicine

## 2019-12-28 DIAGNOSIS — K219 Gastro-esophageal reflux disease without esophagitis: Secondary | ICD-10-CM

## 2019-12-28 MED ORDER — DULOXETINE HCL 60 MG PO CPEP: 60.0000 mg | ORAL_CAPSULE | Freq: Every day | ORAL | 4 refills | Status: DC

## 2019-12-28 MED ORDER — ESOMEPRAZOLE MAGNESIUM 40 MG PO CPDR: 40.0000 mg | DELAYED_RELEASE_CAPSULE | Freq: Two times a day (BID) | ORAL | 2 refills | Status: DC

## 2020-02-21 ENCOUNTER — Ambulatory Visit: Payer: Self-pay | Admitting: Dermatology

## 2020-05-08 ENCOUNTER — Ambulatory Visit: Payer: Self-pay | Admitting: Dermatology

## 2020-06-25 ENCOUNTER — Ambulatory Visit (INDEPENDENT_AMBULATORY_CARE_PROVIDER_SITE_OTHER): Payer: 59 | Admitting: Family Medicine

## 2020-06-25 ENCOUNTER — Other Ambulatory Visit: Payer: Self-pay

## 2020-06-25 ENCOUNTER — Encounter: Payer: Self-pay | Admitting: Family Medicine

## 2020-06-25 VITALS — BP 138/87 | HR 78 | Temp 97.8°F | Ht 71.0 in | Wt 284.0 lb

## 2020-06-25 DIAGNOSIS — Z0000 Encounter for general adult medical examination without abnormal findings: Secondary | ICD-10-CM | POA: Diagnosis not present

## 2020-06-25 DIAGNOSIS — F419 Anxiety disorder, unspecified: Secondary | ICD-10-CM

## 2020-06-25 DIAGNOSIS — I10 Essential (primary) hypertension: Secondary | ICD-10-CM

## 2020-06-25 DIAGNOSIS — Z23 Encounter for immunization: Secondary | ICD-10-CM | POA: Diagnosis not present

## 2020-06-25 DIAGNOSIS — Z1211 Encounter for screening for malignant neoplasm of colon: Secondary | ICD-10-CM

## 2020-06-25 DIAGNOSIS — E785 Hyperlipidemia, unspecified: Secondary | ICD-10-CM | POA: Diagnosis not present

## 2020-06-25 DIAGNOSIS — K219 Gastro-esophageal reflux disease without esophagitis: Secondary | ICD-10-CM

## 2020-06-25 DIAGNOSIS — Z125 Encounter for screening for malignant neoplasm of prostate: Secondary | ICD-10-CM

## 2020-06-25 DIAGNOSIS — R7303 Prediabetes: Secondary | ICD-10-CM

## 2020-06-25 DIAGNOSIS — F331 Major depressive disorder, recurrent, moderate: Secondary | ICD-10-CM

## 2020-06-25 DIAGNOSIS — F32A Depression, unspecified: Secondary | ICD-10-CM

## 2020-06-25 LAB — MICROALBUMIN, URINE WAIVED
Creatinine, Urine Waived: 100 mg/dL (ref 10–300)
Microalb, Ur Waived: 80 mg/L — ABNORMAL HIGH (ref 0–19)

## 2020-06-25 LAB — URINALYSIS, ROUTINE W REFLEX MICROSCOPIC
Bilirubin, UA: NEGATIVE
Glucose, UA: NEGATIVE
Ketones, UA: NEGATIVE
Leukocytes,UA: NEGATIVE
Nitrite, UA: NEGATIVE
RBC, UA: NEGATIVE
Specific Gravity, UA: 1.02 (ref 1.005–1.030)
Urobilinogen, Ur: 0.2 mg/dL (ref 0.2–1.0)
pH, UA: 6.5 (ref 5.0–7.5)

## 2020-06-25 LAB — BAYER DCA HB A1C WAIVED: HB A1C (BAYER DCA - WAIVED): 6.7 % (ref <7.0)

## 2020-06-25 LAB — MICROSCOPIC EXAMINATION
Bacteria, UA: NONE SEEN
RBC, Urine: NONE SEEN /hpf (ref 0–2)
WBC, UA: NONE SEEN /hpf (ref 0–5)

## 2020-06-25 MED ORDER — ATENOLOL 100 MG PO TABS
100.0000 mg | ORAL_TABLET | Freq: Every day | ORAL | 1 refills | Status: DC
Start: 2020-06-25 — End: 2020-11-25

## 2020-06-25 MED ORDER — DULOXETINE HCL 60 MG PO CPEP: 60.0000 mg | ORAL_CAPSULE | Freq: Every day | ORAL | 3 refills | Status: DC

## 2020-06-25 MED ORDER — ESOMEPRAZOLE MAGNESIUM 40 MG PO CPDR
40.0000 mg | DELAYED_RELEASE_CAPSULE | Freq: Two times a day (BID) | ORAL | 3 refills | Status: DC
Start: 2020-06-25 — End: 2020-12-25

## 2020-06-25 MED ORDER — BENAZEPRIL HCL 10 MG PO TABS: 10.0000 mg | ORAL_TABLET | Freq: Every day | ORAL | 1 refills | Status: DC

## 2020-06-25 MED ORDER — CLONAZEPAM 1 MG PO TABS
1.0000 mg | ORAL_TABLET | Freq: Every day | ORAL | 1 refills | Status: DC | PRN
Start: 2020-06-25 — End: 2020-12-25

## 2020-06-25 NOTE — Assessment & Plan Note (Signed)
Rechecking labs today. Await results.  

## 2020-06-25 NOTE — Assessment & Plan Note (Signed)
Under good control on current regimen. Continue current regimen. Continue to monitor. Call with any concerns. Refills given. Labs drawn. Klonopin should last 6 months. Call with any concerns.

## 2020-06-25 NOTE — Assessment & Plan Note (Signed)
Rechecking labs today. Await results. Treat as needed.  °

## 2020-06-25 NOTE — Assessment & Plan Note (Signed)
Under good control on current regimen. Continue current regimen. Continue to monitor. Call with any concerns. Refills given. Labs drawn.   

## 2020-06-25 NOTE — Patient Instructions (Signed)

## 2020-06-25 NOTE — Progress Notes (Signed)
BP 138/87   Pulse 78   Temp 97.8 F (36.6 C)   Ht 5\' 11"  (1.803 m)   Wt 284 lb (128.8 kg)   SpO2 98%   BMI 39.61 kg/m    Subjective:    Patient ID: Aaron Robinson, male    DOB: 11/28/1970, 50 y.o.   MRN: [id number]  HPI: Aaron Robinson is a 50 y.o. male presenting on 06/25/2020 for comprehensive medical examination. Current medical complaints include:  HYPERTENSION / HYPERLIPIDEMIA Satisfied with current treatment? yes Duration of hypertension: chronic BP monitoring frequency: not checking BP medication side effects: no Past BP meds:benazepril, atenonol Duration of hyperlipidemia: chronic Cholesterol medication side effects: not on anything Cholesterol supplements: none Past cholesterol medications: none Medication compliance: excellent compliance Aspirin: no Recent stressors: no Recurrent headaches: yes Visual changes: yes Palpitations: no Dyspnea: no Chest pain: no Lower extremity edema: no Dizzy/lightheaded: no  ANXIETY/STRESS Duration: Chronic Status:stable Anxious mood: yes  Excessive worrying: yes Irritability: no  Sweating: no Nausea: no Palpitations:no Hyperventilation: no Panic attacks: no Agoraphobia: no  Obscessions/compulsions: no Depressed mood: no Depression screen [facility] 2/9 06/25/2020 12/17/2019 06/18/2019 05/16/2019 04/16/2019  Decreased Interest 0 1 2 2 2   Down, Depressed, Hopeless 0 0 1 3 0  PHQ - 2 Score 0 1 3 5 2   Altered sleeping 0 1 2 2 3   Tired, decreased energy 0 1 1 2 2   Change in appetite 0 0 0 1 1  Feeling bad or failure about yourself  0 0 0 1 1  Trouble concentrating 0 0 2 2 2   Moving slowly or fidgety/restless 0 0 0 1 0  Suicidal thoughts 0 0 0 0 0  PHQ-9 Score 0 3 8 14 11   Difficult doing work/chores - - Somewhat difficult Somewhat difficult -   Anhedonia: no Weight changes: no Insomnia: no   Hypersomnia: no Fatigue/loss of energy: no Feelings of worthlessness: no Feelings of guilt: no Impaired  concentration/indecisiveness: no Suicidal ideations: no  Crying spells: no Recent Stressors/Life Changes: no   Relationship problems: no   Family stress: no     Financial stress: no    Job stress: no    Recent death/loss: no  Interim Problems from his last visit: no  Depression Screen done today and results listed below:  Depression screen [facility] 2/9 06/25/2020 12/17/2019 06/18/2019 05/16/2019 04/16/2019  Decreased Interest 0 1 2 2 2   Down, Depressed, Hopeless 0 0 1 3 0  PHQ - 2 Score 0 1 3 5 2   Altered sleeping 0 1 2 2 3   Tired, decreased energy 0 1 1 2 2   Change in appetite 0 0 0 1 1  Feeling bad or failure about yourself  0 0 0 1 1  Trouble concentrating 0 0 2 2 2   Moving slowly or fidgety/restless 0 0 0 1 0  Suicidal thoughts 0 0 0 0 0  PHQ-9 Score 0 3 8 14 11   Difficult doing work/chores - - Somewhat difficult Somewhat difficult -    Past Medical History:  Past Medical History:  Diagnosis Date  . Anxiety   . Depression   . GERD (gastroesophageal reflux disease)   . Hyperlipidemia   . Hypertension     Surgical History:  Past Surgical History:  Procedure Laterality Date  . UPPER GASTROINTESTINAL ENDOSCOPY      Medications:  No current outpatient medications on file prior to visit.   No current facility-administered medications on file prior to visit.    Allergies:  No Known Allergies  Social History:  Social History   Socioeconomic History  . Marital status: Single    Spouse name: Not on file  . Number of children: Not on file  . Years of education: Not on file  . Highest education level: Not on file  Occupational History  . Not on file  Tobacco Use  . Smoking status: Former Smoker    Types: Cigarettes  . Smokeless tobacco: Former Systems developer    Types: Secondary school teacher  . Vaping Use: Never used  Substance and Sexual Activity  . Alcohol use: Yes    Alcohol/week: 0.0 standard drinks    Comment: occassionql  . Drug use: No  . Sexual activity: Not on file   Other Topics Concern  . Not on file  Social History Narrative  . Not on file   Social Determinants of Health   Financial Resource Strain: Not on file  Food Insecurity: Not on file  Transportation Needs: Not on file  Physical Activity: Not on file  Stress: Not on file  Social Connections: Not on file  Intimate Partner Violence: Not on file   Social History   Tobacco Use  Smoking Status Former Smoker  . Types: Cigarettes  Smokeless Tobacco Former Systems developer  . Types: Chew   Social History   Substance and Sexual Activity  Alcohol Use Yes  . Alcohol/week: 0.0 standard drinks   Comment: occassionql    Family History:  Family History  Problem Relation Age of Onset  . Hypertension Mother   . Hypertension Father   . Heart disease Father   . Heart attack Father   . Cancer Paternal Uncle        throat    Past medical history, surgical history, medications, allergies, family history and social history reviewed with patient today and changes made to appropriate areas of the chart.   Review of Systems  Constitutional: Negative.   HENT: Negative.   Eyes: Negative.        Floater  Respiratory: Negative.   Cardiovascular: Negative.   Gastrointestinal: Negative.   Genitourinary: Negative.   Musculoskeletal: Negative.   Skin: Negative.   Neurological: Positive for headaches. Negative for dizziness, tingling, tremors, sensory change, speech change, focal weakness, seizures, loss of consciousness and weakness.  Endo/Heme/Allergies: Negative.   Psychiatric/Behavioral: Negative for depression, hallucinations, memory loss, substance abuse and suicidal ideas. The patient is nervous/anxious. The patient does not have insomnia.    All other ROS negative except what is listed above and in the HPI.      Objective:    BP 138/87   Pulse 78   Temp 97.8 F (36.6 C)   Ht 5\' 11"  (1.803 m)   Wt 284 lb (128.8 kg)   SpO2 98%   BMI 39.61 kg/m   Wt Readings from Last 3 Encounters:   06/25/20 284 lb (128.8 kg)  12/17/19 (!) 281 lb 3.2 oz (127.6 kg)  06/18/19 275 lb (124.7 kg)    Physical Exam Vitals and nursing note reviewed.  Constitutional:      General: He is not in acute distress.    Appearance: Normal appearance. He is obese. He is not ill-appearing, toxic-appearing or diaphoretic.  HENT:     Head: Normocephalic and atraumatic.     Right Ear: Tympanic membrane, ear canal and external ear normal. There is no impacted cerumen.     Left Ear: Tympanic membrane, ear canal and external ear normal. There is no impacted cerumen.  Nose: Nose normal. No congestion or rhinorrhea.     Mouth/Throat:     Mouth: Mucous membranes are moist.     Pharynx: Oropharynx is clear. No oropharyngeal exudate or posterior oropharyngeal erythema.  Eyes:     General: No scleral icterus.       Right eye: No discharge.        Left eye: No discharge.     Extraocular Movements: Extraocular movements intact.     Conjunctiva/sclera: Conjunctivae normal.     Pupils: Pupils are equal, round, and reactive to light.  Neck:     Vascular: No carotid bruit.  Cardiovascular:     Rate and Rhythm: Normal rate and regular rhythm.     Pulses: Normal pulses.     Heart sounds: No murmur heard. No friction rub. No gallop.   Pulmonary:     Effort: Pulmonary effort is normal. No respiratory distress.     Breath sounds: Normal breath sounds. No stridor. No wheezing, rhonchi or rales.  Chest:     Chest wall: No tenderness.  Abdominal:     General: Abdomen is flat. Bowel sounds are normal. There is no distension.     Palpations: Abdomen is soft. There is no mass.     Tenderness: There is no abdominal tenderness. There is no right CVA tenderness, left CVA tenderness, guarding or rebound.     Hernia: No hernia is present.  Genitourinary:    Comments: Genital exam deferred with shared decision making Musculoskeletal:        General: No swelling, tenderness, deformity or signs of injury.      Cervical back: Normal range of motion and neck supple. No rigidity. No muscular tenderness.     Right lower leg: No edema.     Left lower leg: No edema.  Lymphadenopathy:     Cervical: No cervical adenopathy.  Skin:    General: Skin is warm and dry.     Capillary Refill: Capillary refill takes less than 2 seconds.     Coloration: Skin is not jaundiced or pale.     Findings: No bruising, erythema, lesion or rash.  Neurological:     General: No focal deficit present.     Mental Status: He is alert and oriented to person, place, and time.     Cranial Nerves: No cranial nerve deficit.     Sensory: No sensory deficit.     Motor: No weakness.     Coordination: Coordination normal.     Gait: Gait normal.     Deep Tendon Reflexes: Reflexes normal.  Psychiatric:        Mood and Affect: Mood normal.        Behavior: Behavior normal.        Thought Content: Thought content normal.        Judgment: Judgment normal.     Results for orders placed or performed in visit on 12/17/19  Bayer DCA Hb A1c Waived  Result Value Ref Range   HB A1C (BAYER DCA - WAIVED) 6.6 <7.0 %  CBC with Differential/Platelet  Result Value Ref Range   WBC 8.5 3.4 - 10.8 x10E3/uL   RBC 4.66 4.14 - 5.80 x10E6/uL   Hemoglobin 13.6 13.0 - 17.7 g/dL   Hematocrit 41.7 37.5 - 51.0 %   MCV 90 79 - 97 fL   MCH 29.2 26.6 - 33.0 pg   MCHC 32.6 31.5 - 35.7 g/dL   RDW 13.2 11.6 - 15.4 %   Platelets 200  150 - 450 x10E3/uL   Neutrophils 64 Not Estab. %   Lymphs 27 Not Estab. %   Monocytes 7 Not Estab. %   Eos 1 Not Estab. %   Basos 1 Not Estab. %   Neutrophils Absolute 5.5 1.4 - 7.0 x10E3/uL   Lymphocytes Absolute 2.3 0.7 - 3.1 x10E3/uL   Monocytes Absolute 0.6 0.1 - 0.9 x10E3/uL   EOS (ABSOLUTE) 0.1 0.0 - 0.4 x10E3/uL   Basophils Absolute 0.1 0.0 - 0.2 x10E3/uL   Immature Granulocytes 0 Not Estab. %   Immature Grans (Abs) 0.0 0.0 - 0.1 x10E3/uL  Comprehensive metabolic panel  Result Value Ref Range   Glucose 136  (H) 65 - 99 mg/dL   BUN 12 6 - 24 mg/dL   Creatinine, Ser 7.65 0.76 - 1.27 mg/dL   GFR calc non Af Amer 101 >59 mL/min/1.73   GFR calc Af Amer 117 >59 mL/min/1.73   BUN/Creatinine Ratio 14 9 - 20   Sodium 136 134 - 144 mmol/L   Potassium 5.0 3.5 - 5.2 mmol/L   Chloride 97 96 - 106 mmol/L   CO2 26 20 - 29 mmol/L   Calcium 9.5 8.7 - 10.2 mg/dL   Total Protein 7.5 6.0 - 8.5 g/dL   Albumin 4.3 4.0 - 5.0 g/dL   Globulin, Total 3.2 1.5 - 4.5 g/dL   Albumin/Globulin Ratio 1.3 1.2 - 2.2   Bilirubin Total 0.3 0.0 - 1.2 mg/dL   Alkaline Phosphatase 78 48 - 121 IU/L   AST 19 0 - 40 IU/L   ALT 26 0 - 44 IU/L  Lipid Panel w/o Chol/HDL Ratio  Result Value Ref Range   Cholesterol, Total 164 100 - 199 mg/dL   Triglycerides 465 (H) 0 - 149 mg/dL   HDL 40 >03 mg/dL   VLDL Cholesterol Cal 29 5 - 40 mg/dL   LDL Chol Calc (NIH) 95 0 - 99 mg/dL  Hepatitis C Antibody  Result Value Ref Range   Hep C Virus Ab <0.1 0.0 - 0.9 s/co ratio      Assessment & Plan:   Problem List Items Addressed This Visit      Cardiovascular and Mediastinum   Hypertension    Under good control on current regimen. Continue current regimen. Continue to monitor. Call with any concerns. Refills given. Labs drawn.        Relevant Medications   atenolol (TENORMIN) 100 MG tablet   benazepril (LOTENSIN) 10 MG tablet   Other Relevant Orders   Microalbumin, Urine Waived   Comprehensive metabolic panel   CBC with Differential/Platelet   TSH   Urinalysis, Routine w reflex microscopic     Digestive   GERD (gastroesophageal reflux disease)    Under good control on current regimen. Continue current regimen. Continue to monitor. Call with any concerns. Refills given. Labs drawn.       Relevant Medications   esomeprazole (NEXIUM) 40 MG capsule   Other Relevant Orders   Comprehensive metabolic panel   CBC with Differential/Platelet     Other   Anxiety    Under good control on current regimen. Continue current regimen.  Continue to monitor. Call with any concerns. Refills given. Labs drawn. Klonopin should last 6 months. Call with any concerns.       Relevant Medications   DULoxetine (CYMBALTA) 60 MG capsule   Other Relevant Orders   Comprehensive metabolic panel   CBC with Differential/Platelet   TSH   Hyperlipidemia    Rechecking labs today.  Await results.       Relevant Medications   atenolol (TENORMIN) 100 MG tablet   benazepril (LOTENSIN) 10 MG tablet   Other Relevant Orders   Comprehensive metabolic panel   CBC with Differential/Platelet   Lipid Panel w/o Chol/HDL Ratio   Depression    Under good control on current regimen. Continue current regimen. Continue to monitor. Call with any concerns. Refills given. Labs drawn.       Relevant Medications   clonazePAM (KLONOPIN) 1 MG tablet   DULoxetine (CYMBALTA) 60 MG capsule   Other Relevant Orders   Comprehensive metabolic panel   CBC with Differential/Platelet   TSH   Prediabetes    Rechecking labs today. Await results. Treat as needed.       Relevant Orders   Bayer DCA Hb A1c Waived   Comprehensive metabolic panel   CBC with Differential/Platelet   Urinalysis, Routine w reflex microscopic    Other Visit Diagnoses    Routine general medical examination at a health care facility    -  Primary   Vaccines up to date. Screening labs checked today. Colonoscopy ordered. Continue diet and exercise. Call with any concerns.    Relevant Orders   Homocysteine   Screening for prostate cancer       Labs drawn today. await results.    Relevant Orders   PSA   Needs flu shot       Flu shot given today.    Relevant Orders   Flu Vaccine QUAD 6+ mos PF IM (Fluarix Quad PF)   Screening for colon cancer       Referral to GI placed today.   Relevant Orders   Ambulatory referral to Gastroenterology   Essential hypertension       Relevant Medications   atenolol (TENORMIN) 100 MG tablet   benazepril (LOTENSIN) 10 MG tablet       Discussed  aspirin prophylaxis for myocardial infarction prevention and decision was it was not indicated  LABORATORY TESTING:  Health maintenance labs ordered today as discussed above.   The natural history of prostate cancer and ongoing controversy regarding screening and potential treatment outcomes of prostate cancer has been discussed with the patient. The meaning of a false positive PSA and a false negative PSA has been discussed. He indicates understanding of the limitations of this screening test and wishes  to proceed with screening PSA testing.   IMMUNIZATIONS:   - Tdap: Tetanus vaccination status reviewed: last tetanus booster within 10 years. - Influenza: Administered today - Pneumovax: Not applicable - COVID: Up to date  SCREENING: - Colonoscopy: Ordered today  Discussed with patient purpose of the colonoscopy is to detect colon cancer at curable precancerous or early stages   PATIENT COUNSELING:    Sexuality: Discussed sexually transmitted diseases, partner selection, use of condoms, avoidance of unintended pregnancy  and contraceptive alternatives.   Advised to avoid cigarette smoking.  I discussed with the patient that most people either abstain from alcohol or drink within safe limits (<=14/week and <=4 drinks/occasion for males, <=7/weeks and <= 3 drinks/occasion for females) and that the risk for alcohol disorders and other health effects rises proportionally with the number of drinks per week and how often a drinker exceeds daily limits.  Discussed cessation/primary prevention of drug use and availability of treatment for abuse.   Diet: Encouraged to adjust caloric intake to maintain  or achieve ideal body weight, to reduce intake of dietary saturated fat and total fat, to limit  sodium intake by avoiding high sodium foods and not adding table salt, and to maintain adequate dietary potassium and calcium preferably from fresh fruits, vegetables, and low-fat dairy products.     stressed the importance of regular exercise  Injury prevention: Discussed safety belts, safety helmets, smoke detector, smoking near bedding or upholstery.   Dental health: Discussed importance of regular tooth brushing, flossing, and dental visits.   Follow up plan: NEXT PREVENTATIVE PHYSICAL DUE IN 1 YEAR. Return in about 6 months (around 12/23/2020).

## 2020-06-26 ENCOUNTER — Encounter: Payer: Self-pay | Admitting: Family Medicine

## 2020-06-26 LAB — COMPREHENSIVE METABOLIC PANEL
ALT: 32 IU/L (ref 0–44)
AST: 20 IU/L (ref 0–40)
Albumin/Globulin Ratio: 1.3 (ref 1.2–2.2)
Albumin: 4.5 g/dL (ref 4.0–5.0)
Alkaline Phosphatase: 74 IU/L (ref 44–121)
BUN/Creatinine Ratio: 15 (ref 9–20)
BUN: 13 mg/dL (ref 6–24)
Bilirubin Total: 0.4 mg/dL (ref 0.0–1.2)
CO2: 22 mmol/L (ref 20–29)
Calcium: 9.4 mg/dL (ref 8.7–10.2)
Chloride: 99 mmol/L (ref 96–106)
Creatinine, Ser: 0.85 mg/dL (ref 0.76–1.27)
GFR calc Af Amer: 117 mL/min/{1.73_m2} (ref >59)
GFR calc non Af Amer: 102 mL/min/{1.73_m2} (ref >59)
Globulin, Total: 3.5 g/dL (ref 1.5–4.5)
Glucose: 131 mg/dL — ABNORMAL HIGH (ref 65–99)
Potassium: 4.4 mmol/L (ref 3.5–5.2)
Sodium: 139 mmol/L (ref 134–144)
Total Protein: 8 g/dL (ref 6.0–8.5)

## 2020-06-26 LAB — CBC WITH DIFFERENTIAL/PLATELET
Basophils Absolute: 0 10*3/uL (ref 0.0–0.2)
Basos: 1 %
EOS (ABSOLUTE): 0.2 10*3/uL (ref 0.0–0.4)
Eos: 2 %
Hematocrit: 46.1 % (ref 37.5–51.0)
Hemoglobin: 15.1 g/dL (ref 13.0–17.7)
Immature Grans (Abs): 0 10*3/uL (ref 0.0–0.1)
Immature Granulocytes: 0 %
Lymphocytes Absolute: 1.6 10*3/uL (ref 0.7–3.1)
Lymphs: 23 %
MCH: 26.7 pg (ref 26.6–33.0)
MCHC: 32.8 g/dL (ref 31.5–35.7)
MCV: 81 fL (ref 79–97)
Monocytes Absolute: 0.5 10*3/uL (ref 0.1–0.9)
Monocytes: 7 %
Neutrophils Absolute: 4.6 10*3/uL (ref 1.4–7.0)
Neutrophils: 67 %
Platelets: 272 10*3/uL (ref 150–450)
RBC: 5.66 x10E6/uL (ref 4.14–5.80)
RDW: 13.9 % (ref 11.6–15.4)
WBC: 7 10*3/uL (ref 3.4–10.8)

## 2020-06-26 LAB — HOMOCYSTEINE: Homocysteine: 10 umol/L (ref 0.0–14.5)

## 2020-06-26 LAB — TSH: TSH: 1.23 u[IU]/mL (ref 0.450–4.500)

## 2020-06-26 LAB — PSA: Prostate Specific Ag, Serum: 0.5 ng/mL (ref 0.0–4.0)

## 2020-06-26 LAB — LIPID PANEL W/O CHOL/HDL RATIO
Cholesterol, Total: 181 mg/dL (ref 100–199)
HDL: 40 mg/dL (ref >39)
LDL Chol Calc (NIH): 113 mg/dL — ABNORMAL HIGH (ref 0–99)
Triglycerides: 155 mg/dL — ABNORMAL HIGH (ref 0–149)
VLDL Cholesterol Cal: 28 mg/dL (ref 5–40)

## 2020-07-01 ENCOUNTER — Telehealth (INDEPENDENT_AMBULATORY_CARE_PROVIDER_SITE_OTHER): Payer: Self-pay | Admitting: Gastroenterology

## 2020-07-01 ENCOUNTER — Other Ambulatory Visit: Payer: Self-pay

## 2020-07-01 DIAGNOSIS — Z1211 Encounter for screening for malignant neoplasm of colon: Secondary | ICD-10-CM

## 2020-07-01 MED ORDER — NA SULFATE-K SULFATE-MG SULF 17.5-3.13-1.6 GM/177ML PO SOLN: 1.0000 | Freq: Once | ORAL | 0 refills | Status: AC

## 2020-07-01 NOTE — Progress Notes (Signed)
Gastroenterology Pre-Procedure Review  Request Date: 07/11/20 Requesting Physician: Dr. Bonna Gains  PATIENT REVIEW QUESTIONS: The patient responded to the following health history questions as indicated:    1. Are you having any GI issues? no 2. Do you have a personal history of Polyps? no 3. Do you have a family history of Colon Cancer or Polyps? no 4. Diabetes Mellitus? no 5. Joint replacements in the past 12 months?no 6. Major health problems in the past 3 months?no 7. Any artificial heart valves, MVP, or defibrillator?no    MEDICATIONS & ALLERGIES:    Patient reports the following regarding taking any anticoagulation/antiplatelet therapy:   Plavix, Coumadin, Eliquis, Xarelto, Lovenox, Pradaxa, Brilinta, or Effient? no Aspirin? no  Patient confirms/reports the following medications:  Current Outpatient Medications  Medication Sig Dispense Refill  . atenolol (TENORMIN) 100 MG tablet Take 1 tablet (100 mg total) by mouth daily. 90 tablet 1  . benazepril (LOTENSIN) 10 MG tablet Take 1 tablet (10 mg total) by mouth daily. 90 tablet 1  . clonazePAM (KLONOPIN) 1 MG tablet Take 1 tablet (1 mg total) by mouth daily as needed for anxiety (1/2 each AM and 1/2 PRN). 30 tablet 1  . DULoxetine (CYMBALTA) 60 MG capsule Take 1 capsule (60 mg total) by mouth daily. 90 capsule 3  . esomeprazole (NEXIUM) 40 MG capsule Take 1 capsule (40 mg total) by mouth 2 (two) times daily before a meal. 180 capsule 3   No current facility-administered medications for this visit.    Patient confirms/reports the following allergies:  No Known Allergies  No orders of the defined types were placed in this encounter.   AUTHORIZATION INFORMATION Primary Insurance: 1D#: Group #:  Secondary Insurance: 1D#: Group #:  SCHEDULE INFORMATION: Date: Friday 07/11/20 Time: Location:[facility]

## 2020-07-07 ENCOUNTER — Telehealth (INDEPENDENT_AMBULATORY_CARE_PROVIDER_SITE_OTHER): Payer: 59 | Admitting: Family Medicine

## 2020-07-07 ENCOUNTER — Encounter: Payer: Self-pay | Admitting: Family Medicine

## 2020-07-07 VITALS — BP 136/82

## 2020-07-07 DIAGNOSIS — E1129 Type 2 diabetes mellitus with other diabetic kidney complication: Secondary | ICD-10-CM

## 2020-07-07 DIAGNOSIS — E118 Type 2 diabetes mellitus with unspecified complications: Secondary | ICD-10-CM

## 2020-07-07 DIAGNOSIS — F418 Other specified anxiety disorders: Secondary | ICD-10-CM

## 2020-07-07 DIAGNOSIS — R809 Proteinuria, unspecified: Secondary | ICD-10-CM

## 2020-07-07 NOTE — Progress Notes (Signed)
BP 136/82    Subjective:    Patient ID: Aaron Robinson, male    DOB: [birth year], 50 y.o.   MRN: [id number]  HPI: Aaron Robinson is a 50 y.o. male  Chief Complaint  Patient presents with  . Diabetes    Pt states he recently had blood work done and would like to discuss recent lab work.    [name] presents today over video visit concerned about his lab work. He would like to discuss his bloodwork done 06/25/20. He has been feeling well.   DIABETES Hypoglycemic episodes:no Polydipsia/polyuria: no Visual disturbance: no Chest pain: no Paresthesias: no Glucose Monitoring: no  Accucheck frequency: Not Checking Taking Insulin?: no Blood Pressure Monitoring: not checking Retinal Examination: Not up to Date Foot Exam: Not up to Date Diabetic Education: Not Completed Pneumovax: Not up to Date Influenza: Up to Date Aspirin: no   Relevant past medical, surgical, family and social history reviewed and updated as indicated. Interim medical history since our last visit reviewed. Allergies and medications reviewed and updated.  Review of Systems  Constitutional: Negative.   Respiratory: Negative.   Cardiovascular: Negative.   Gastrointestinal: Negative.   Musculoskeletal: Negative.   Neurological: Negative.   Psychiatric/Behavioral: Negative.     Per HPI unless specifically indicated above     Objective:    BP 136/82   Wt Readings from Last 3 Encounters:  06/25/20 284 lb (128.8 kg)  12/17/19 (!) 281 lb 3.2 oz (127.6 kg)  06/18/19 275 lb (124.7 kg)    Physical Exam Vitals and nursing note reviewed.  Constitutional:      General: He is not in acute distress.    Appearance: Normal appearance. He is not ill-appearing, toxic-appearing or diaphoretic.  HENT:     Head: Normocephalic and atraumatic.     Right Ear: External ear normal.     Left Ear: External ear normal.     Nose: Nose normal.     Mouth/Throat:     Mouth: Mucous membranes are moist.     Pharynx:  Oropharynx is clear.  Eyes:     General: No scleral icterus.       Right eye: No discharge.        Left eye: No discharge.     Conjunctiva/sclera: Conjunctivae normal.     Pupils: Pupils are equal, round, and reactive to light.  Pulmonary:     Effort: Pulmonary effort is normal. No respiratory distress.     Comments: Speaking in full sentences Musculoskeletal:        General: Normal range of motion.     Cervical back: Normal range of motion.  Skin:    Coloration: Skin is not jaundiced or pale.     Findings: No bruising, erythema, lesion or rash.  Neurological:     Mental Status: He is alert and oriented to person, place, and time. Mental status is at baseline.  Psychiatric:        Mood and Affect: Mood normal.        Behavior: Behavior normal.        Thought Content: Thought content normal.        Judgment: Judgment normal.     Results for orders placed or performed in visit on 06/25/20  Microscopic Examination   BLD  Result Value Ref Range   WBC, UA None seen 0 - 5 /hpf   RBC None seen 0 - 2 /hpf   Epithelial Cells (non renal) 0-10 0 - 10 /hpf  Bacteria, UA None seen None seen/Few  Bayer DCA Hb A1c Waived  Result Value Ref Range   HB A1C (BAYER DCA - WAIVED) 6.7 <7.0 %  Microalbumin, Urine Waived  Result Value Ref Range   Microalb, Ur Waived 80 (H) 0 - 19 mg/L   Creatinine, Urine Waived 100 10 - 300 mg/dL   Microalb/Creat Ratio 30-300 (H) <30 mg/g  Comprehensive metabolic panel  Result Value Ref Range   Glucose 131 (H) 65 - 99 mg/dL   BUN 13 6 - 24 mg/dL   Creatinine, Ser 8.34 0.76 - 1.27 mg/dL   GFR calc non Af Amer 102 >59 mL/min/1.73   GFR calc Af Amer 117 >59 mL/min/1.73   BUN/Creatinine Ratio 15 9 - 20   Sodium 139 134 - 144 mmol/L   Potassium 4.4 3.5 - 5.2 mmol/L   Chloride 99 96 - 106 mmol/L   CO2 22 20 - 29 mmol/L   Calcium 9.4 8.7 - 10.2 mg/dL   Total Protein 8.0 6.0 - 8.5 g/dL   Albumin 4.5 4.0 - 5.0 g/dL   Globulin, Total 3.5 1.5 - 4.5 g/dL    Albumin/Globulin Ratio 1.3 1.2 - 2.2   Bilirubin Total 0.4 0.0 - 1.2 mg/dL   Alkaline Phosphatase 74 44 - 121 IU/L   AST 20 0 - 40 IU/L   ALT 32 0 - 44 IU/L  CBC with Differential/Platelet  Result Value Ref Range   WBC 7.0 3.4 - 10.8 x10E3/uL   RBC 5.66 4.14 - 5.80 x10E6/uL   Hemoglobin 15.1 13.0 - 17.7 g/dL   Hematocrit 19.6 22.2 - 51.0 %   MCV 81 79 - 97 fL   MCH 26.7 26.6 - 33.0 pg   MCHC 32.8 31.5 - 35.7 g/dL   RDW 97.9 89.2 - 11.9 %   Platelets 272 150 - 450 x10E3/uL   Neutrophils 67 Not Estab. %   Lymphs 23 Not Estab. %   Monocytes 7 Not Estab. %   Eos 2 Not Estab. %   Basos 1 Not Estab. %   Neutrophils Absolute 4.6 1.4 - 7.0 x10E3/uL   Lymphocytes Absolute 1.6 0.7 - 3.1 x10E3/uL   Monocytes Absolute 0.5 0.1 - 0.9 x10E3/uL   EOS (ABSOLUTE) 0.2 0.0 - 0.4 x10E3/uL   Basophils Absolute 0.0 0.0 - 0.2 x10E3/uL   Immature Granulocytes 0 Not Estab. %   Immature Grans (Abs) 0.0 0.0 - 0.1 x10E3/uL  Lipid Panel w/o Chol/HDL Ratio  Result Value Ref Range   Cholesterol, Total 181 100 - 199 mg/dL   Triglycerides 417 (H) 0 - 149 mg/dL   HDL 40 >40 mg/dL   VLDL Cholesterol Cal 28 5 - 40 mg/dL   LDL Chol Calc (NIH) 814 (H) 0 - 99 mg/dL  PSA  Result Value Ref Range   Prostate Specific Ag, Serum 0.5 0.0 - 4.0 ng/mL  TSH  Result Value Ref Range   TSH 1.230 0.450 - 4.500 uIU/mL  Urinalysis, Routine w reflex microscopic  Result Value Ref Range   Specific Gravity, UA 1.020 1.005 - 1.030   pH, UA 6.5 5.0 - 7.5   Color, UA Yellow Yellow   Appearance Ur Clear Clear   Leukocytes,UA Negative Negative   Protein,UA 1+ (A) Negative/Trace   Glucose, UA Negative Negative   Ketones, UA Negative Negative   RBC, UA Negative Negative   Bilirubin, UA Negative Negative   Urobilinogen, Ur 0.2 0.2 - 1.0 mg/dL   Nitrite, UA Negative Negative   Microscopic Examination  See below:   Homocysteine  Result Value Ref Range   Homocysteine 10.0 0.0 - 14.5 umol/L      Assessment & Plan:   Problem  List Items Addressed This Visit      Endocrine   Controlled diabetes mellitus type 2 with complications (HCC) - Primary    Stable with A1c of 6.7. Diet controlled. Continue diet and exercise. Call with any concerns.           Follow up plan: Return in about 6 months (around 01/04/2021).    . This visit was completed via MyChart due to the restrictions of the COVID-19 pandemic. All issues as above were discussed and addressed. Physical exam was done as above through visual confirmation on MyChart. If it was felt that the patient should be evaluated in the office, they were directed there. The patient verbally consented to this visit. . Location of the patient: home . Location of the provider: work . Those involved with this call:  . Provider: Olevia Perches, DO . CMA: Malen Gauze, CMA . Front Desk/Registration: Harriet Pho  . Time spent on call: 15 minutes with patient face to face via video conference. More than 50% of this time was spent in counseling and coordination of care. 23 minutes total spent in review of patient's record and preparation of their chart.

## 2020-07-08 ENCOUNTER — Encounter: Payer: Self-pay | Admitting: Family Medicine

## 2020-07-08 NOTE — Assessment & Plan Note (Signed)
Stable with A1c of 6.7. Diet controlled. Continue diet and exercise. Call with any concerns.

## 2020-07-09 ENCOUNTER — Other Ambulatory Visit: Payer: Self-pay

## 2020-07-09 ENCOUNTER — Other Ambulatory Visit
Admission: RE | Admit: 2020-07-09 | Discharge: 2020-07-09 | Disposition: A | Discharge status: Home / Self Care | Payer: 59 | Source: Ambulatory Visit | Attending: Gastroenterology | Admitting: Gastroenterology

## 2020-07-09 DIAGNOSIS — Z01812 Encounter for preprocedural laboratory examination: Secondary | ICD-10-CM | POA: Insufficient documentation

## 2020-07-09 DIAGNOSIS — Z20822 Contact with and (suspected) exposure to covid-19: Secondary | ICD-10-CM | POA: Diagnosis not present

## 2020-07-10 ENCOUNTER — Encounter: Payer: Self-pay | Admitting: Gastroenterology

## 2020-07-10 LAB — SARS CORONAVIRUS 2 (TAT 6-24 HRS): SARS Coronavirus 2: NEGATIVE

## 2020-07-11 ENCOUNTER — Ambulatory Visit: Payer: 59 | Admitting: Anesthesiology

## 2020-07-11 ENCOUNTER — Ambulatory Visit
Admission: RE | Admit: 2020-07-11 | Discharge: 2020-07-11 | Disposition: A | Discharge status: Home / Self Care | Payer: 59 | Attending: Gastroenterology | Admitting: Gastroenterology

## 2020-07-11 ENCOUNTER — Encounter: Payer: Self-pay | Admitting: Gastroenterology

## 2020-07-11 ENCOUNTER — Other Ambulatory Visit: Payer: Self-pay

## 2020-07-11 ENCOUNTER — Encounter
Admission: RE | Disposition: A | Discharge status: Home / Self Care | Payer: Self-pay | Source: Home / Self Care | Attending: Gastroenterology

## 2020-07-11 DIAGNOSIS — Z87891 Personal history of nicotine dependence: Secondary | ICD-10-CM | POA: Insufficient documentation

## 2020-07-11 DIAGNOSIS — Z79899 Other long term (current) drug therapy: Secondary | ICD-10-CM | POA: Insufficient documentation

## 2020-07-11 DIAGNOSIS — Z1211 Encounter for screening for malignant neoplasm of colon: Secondary | ICD-10-CM

## 2020-07-11 DIAGNOSIS — K621 Rectal polyp: Secondary | ICD-10-CM | POA: Diagnosis not present

## 2020-07-11 DIAGNOSIS — K635 Polyp of colon: Secondary | ICD-10-CM | POA: Diagnosis not present

## 2020-07-11 HISTORY — PX: COLONOSCOPY WITH PROPOFOL: SHX5780

## 2020-07-11 SURGERY — COLONOSCOPY WITH PROPOFOL
Anesthesia: General

## 2020-07-11 MED ORDER — MIDAZOLAM HCL 2 MG/2ML IJ SOLN
INTRAMUSCULAR | Status: DC | PRN
  Administered 2020-07-11: 1 mg via INTRAVENOUS

## 2020-07-11 MED ORDER — PROPOFOL 500 MG/50ML IV EMUL
INTRAVENOUS | Status: AC
  Filled 2020-07-11: qty 50

## 2020-07-11 MED ORDER — DEXMEDETOMIDINE (PRECEDEX) IN NS 20 MCG/5ML (4 MCG/ML) IV SYRINGE
PREFILLED_SYRINGE | INTRAVENOUS | Status: DC | PRN
  Administered 2020-07-11 (×2): 4 ug via INTRAVENOUS

## 2020-07-11 MED ORDER — DEXMEDETOMIDINE (PRECEDEX) IN NS 20 MCG/5ML (4 MCG/ML) IV SYRINGE
PREFILLED_SYRINGE | INTRAVENOUS | Status: AC
  Filled 2020-07-11: qty 5

## 2020-07-11 MED ORDER — LIDOCAINE HCL (PF) 2 % IJ SOLN
INTRAMUSCULAR | Status: AC
  Filled 2020-07-11: qty 5

## 2020-07-11 MED ORDER — LIDOCAINE HCL (CARDIAC) PF 100 MG/5ML IV SOSY
PREFILLED_SYRINGE | INTRAVENOUS | Status: DC | PRN
  Administered 2020-07-11: 100 mg via INTRAVENOUS

## 2020-07-11 MED ORDER — MIDAZOLAM HCL 2 MG/2ML IJ SOLN
INTRAMUSCULAR | Status: AC
  Filled 2020-07-11: qty 2

## 2020-07-11 MED ORDER — SODIUM CHLORIDE 0.9 % IV SOLN: INTRAVENOUS | Status: DC

## 2020-07-11 MED ORDER — PROPOFOL 500 MG/50ML IV EMUL
INTRAVENOUS | Status: DC | PRN
  Administered 2020-07-11: 140 ug/kg/min via INTRAVENOUS

## 2020-07-11 MED ORDER — PROPOFOL 10 MG/ML IV BOLUS
INTRAVENOUS | Status: AC
  Filled 2020-07-11: qty 20

## 2020-07-11 MED ORDER — PROPOFOL 10 MG/ML IV BOLUS
INTRAVENOUS | Status: DC | PRN
  Administered 2020-07-11: 20 mg via INTRAVENOUS
  Administered 2020-07-11: 60 mg via INTRAVENOUS
  Administered 2020-07-11: 20 mg via INTRAVENOUS

## 2020-07-11 NOTE — Transfer of Care (Signed)
Immediate Anesthesia Transfer of Care Note  Patient: Aaron Robinson  Procedure(s) Performed: COLONOSCOPY WITH PROPOFOL (N/A )  Patient Location: PACU and Endoscopy Unit  Anesthesia Type:General  Level of Consciousness: drowsy and patient cooperative  Airway & Oxygen Therapy: Patient Spontanous Breathing and Patient connected to nasal cannula oxygen  Post-op Assessment: Report given to RN and Post -op Vital signs reviewed and stable  Post vital signs: Reviewed and stable  Last Vitals:  Vitals Value Taken Time  BP 100/62 07/11/20 1239  Temp    Pulse 74 07/11/20 1240  Resp 16 07/11/20 1240  SpO2 95 % 07/11/20 1240  Vitals shown include unvalidated device data.  Last Pain:  Vitals:   07/11/20 1239  TempSrc:   PainSc: 0-No pain         Complications: No complications documented.

## 2020-07-11 NOTE — Op Note (Signed)
[facility] Gastroenterology Patient Name: Aaron Robinson Procedure Date: 07/11/2020 11:08 AM MRN: [id number] Account #: [id number] Date of Birth: 15-Aug-1970 Admit Type: Outpatient Age: 50 Room: [facility] ENDO ROOM 2 Gender: Male Note Status: Finalized Procedure:             Colonoscopy Indications:           Screening for colorectal malignant neoplasm Providers:             Terryl Niziolek B. Bonna Gains MD, MD Referring MD:          Valerie Roys (Referring MD) Medicines:             Monitored Anesthesia Care Complications:         No immediate complications. Procedure:             Pre-Anesthesia Assessment:                        - ASA Grade Assessment: II - A patient with mild                         systemic disease.                        - Prior to the procedure, a History and Physical was                         performed, and patient medications, allergies and                         sensitivities were reviewed. The patient's tolerance                         of previous anesthesia was reviewed.                        - The risks and benefits of the procedure and the                         sedation options and risks were discussed with the                         patient. All questions were answered and informed                         consent was obtained.                        - Patient identification and proposed procedure were                         verified prior to the procedure by the physician, the                         nurse, the anesthesiologist, the anesthetist and the                         technician. The procedure was verified in the                         procedure room.  After obtaining informed consent, the colonoscope was                         passed under direct vision. Throughout the procedure,                         the patient's blood pressure, pulse, and oxygen                         saturations were  monitored continuously. The                         Colonoscope was introduced through the anus and                         advanced to the the cecum, identified by appendiceal                         orifice and ileocecal valve. The colonoscopy was                         performed with ease. The patient tolerated the                         procedure well. The quality of the bowel preparation                         was good. Findings:      The perianal and digital rectal examinations were normal.      A 4 mm polyp was found in the rectum. The polyp was flat. The polyp was       removed with a cold snare. Resection and retrieval were complete.      The exam was otherwise without abnormality.      The rectum, sigmoid colon, descending colon, transverse colon, ascending       colon and cecum appeared normal.      The retroflexed view of the distal rectum and anal verge was normal and       showed no anal or rectal abnormalities. Impression:            - One 4 mm polyp in the rectum, removed with a cold                         snare. Resected and retrieved.                        - The examination was otherwise normal.                        - The rectum, sigmoid colon, descending colon,                         transverse colon, ascending colon and cecum are normal.                        - The distal rectum and anal verge are normal on                         retroflexion  view. Recommendation:        - Await pathology results.                        - Discharge patient to home (with escort).                        - Advance diet as tolerated.                        - Continue present medications.                        - Repeat colonoscopy date to be determined after                         pending pathology results are reviewed.                        - The findings and recommendations were discussed with                         the patient.                        - The findings and  recommendations were discussed with                         the patient's family.                        - Return to primary care physician as previously                         scheduled. Procedure Code(s):     --- Professional ---                        [phone], Colonoscopy, flexible; with removal of                         tumor(s), polyp(s), or other lesion(s) by snare                         technique Diagnosis Code(s):     --- Professional ---                        Z12.11, Encounter for screening for malignant neoplasm                         of colon                        K62.1, Rectal polyp CPT copyright 2019 American Medical Association. All rights reserved. The codes documented in this report are preliminary and upon coder review may  be revised to meet current compliance requirements.  Vonda Antigua, MD Margretta Sidle B. Bonna Gains MD, MD 07/11/2020 12:39:45 PM This report has been signed electronically. Number of Addenda: 0 Note Initiated On: 07/11/2020 11:08 AM Scope Withdrawal Time: 0 hours 15 minutes 15 seconds  Total Procedure Duration: 0 hours 19 minutes 1 second  Estimated Blood Loss:  Estimated blood loss:  none.      [facility]

## 2020-07-11 NOTE — Anesthesia Procedure Notes (Signed)
Procedure Name: MAC Date/Time: 07/11/2020 12:02 PM Performed by: Lily Peer, Rajan Burgard, CRNA Pre-anesthesia Checklist: Patient identified, Suction available, Emergency Drugs available, Patient being monitored and Timeout performed Oxygen Delivery Method: Nasal cannula Induction Type: IV induction

## 2020-07-11 NOTE — Anesthesia Preprocedure Evaluation (Signed)
Anesthesia Evaluation  Patient identified by MRN, date of birth, ID band Patient awake    Reviewed: Allergy & Precautions, H&P , NPO status , Patient's Chart, lab work & pertinent test results, reviewed documented beta blocker date and time   Airway Mallampati: II   Neck ROM: full    Dental  (+) Poor Dentition   Pulmonary sleep apnea and Continuous Positive Airway Pressure Ventilation , former smoker,    Pulmonary exam normal        Cardiovascular Exercise Tolerance: Good hypertension, On Medications negative cardio ROS Normal cardiovascular exam Rhythm:regular Rate:Normal     Neuro/Psych PSYCHIATRIC DISORDERS Anxiety Depression negative neurological ROS     GI/Hepatic negative GI ROS, Neg liver ROS,   Endo/Other  diabetes, Well Controlled  Renal/GU negative Renal ROS  negative genitourinary   Musculoskeletal   Abdominal   Peds  Hematology negative hematology ROS (+)   Anesthesia Other Findings Past Medical History: No date: Anxiety No date: Depression No date: GERD (gastroesophageal reflux disease) No date: Hyperlipidemia No date: Hypertension Past Surgical History: No date: UPPER GASTROINTESTINAL ENDOSCOPY BMI    Body Mass Index: 39.05 kg/m     Reproductive/Obstetrics negative OB ROS                             Anesthesia Physical Anesthesia Plan  ASA: III  Anesthesia Plan: General   Post-op Pain Management:    Induction:   PONV Risk Score and Plan:   Airway Management Planned:   Additional Equipment:   Intra-op Plan:   Post-operative Plan:   Informed Consent: I have reviewed the patients History and Physical, chart, labs and discussed the procedure including the risks, benefits and alternatives for the proposed anesthesia with the patient or authorized representative who has indicated his/her understanding and acceptance.     Dental Advisory Given  Plan  Discussed with: CRNA  Anesthesia Plan Comments:         Anesthesia Quick Evaluation

## 2020-07-11 NOTE — H&P (Signed)
Vonda Antigua, MD [address], [place], [place], [place], [address] [address], [place], [place], [place], [address] Phone: [phone]  Fax: [phone]  Primary Care Physician:  Valerie Roys, DO   Pre-Procedure History & Physical: HPI:  Aaron Robinson is a 50 y.o. male is here for a colonoscopy.   Past Medical History:  Diagnosis Date  . Anxiety   . Depression   . GERD (gastroesophageal reflux disease)   . Hyperlipidemia   . Hypertension     Past Surgical History:  Procedure Laterality Date  . UPPER GASTROINTESTINAL ENDOSCOPY      Prior to Admission medications   Medication Sig Start Date End Date Taking? Authorizing Provider  atenolol (TENORMIN) 100 MG tablet Take 1 tablet (100 mg total) by mouth daily. 06/25/20  Yes Johnson, Megan P, DO  benazepril (LOTENSIN) 10 MG tablet Take 1 tablet (10 mg total) by mouth daily. 06/25/20  Yes Johnson, Megan P, DO  DULoxetine (CYMBALTA) 60 MG capsule Take 1 capsule (60 mg total) by mouth daily. 06/25/20  Yes Johnson, Megan P, DO  esomeprazole (NEXIUM) 40 MG capsule Take 1 capsule (40 mg total) by mouth 2 (two) times daily before a meal. 06/25/20  Yes Johnson, Megan P, DO  clonazePAM (KLONOPIN) 1 MG tablet Take 1 tablet (1 mg total) by mouth daily as needed for anxiety (1/2 each AM and 1/2 PRN). 06/25/20   [place] P, DO    Allergies as of 07/01/2020  . (No Known Allergies)    Family History  Problem Relation Age of Onset  . Hypertension Mother   . Hypertension Father   . Heart disease Father   . Heart attack Father   . Cancer Paternal Uncle        throat    Social History   Socioeconomic History  . Marital status: Single    Spouse name: Not on file  . Number of children: Not on file  . Years of education: Not on file  . Highest education level: Not on file  Occupational History  . Not on file  Tobacco Use  . Smoking status: Former Smoker    Types: Cigarettes  . Smokeless tobacco: Former Systems developer     Types: Secondary school teacher  . Vaping Use: Never used  Substance and Sexual Activity  . Alcohol use: Yes    Alcohol/week: 0.0 standard drinks    Comment: occassionql  . Drug use: No  . Sexual activity: Not on file  Other Topics Concern  . Not on file  Social History Narrative  . Not on file   Social Determinants of Health   Financial Resource Strain: Not on file  Food Insecurity: Not on file  Transportation Needs: Not on file  Physical Activity: Not on file  Stress: Not on file  Social Connections: Not on file  Intimate Partner Violence: Not on file    Review of Systems: See HPI, otherwise negative ROS  Physical Exam: BP (!) 147/99   Pulse 74   Temp (!) 96.4 F (35.8 C) (Temporal)   Resp 18   Ht 5\' 11"  (1.803 m)   Wt 127 kg   SpO2 100%   BMI 39.05 kg/m  General:   Alert,  pleasant and cooperative in NAD Head:  Normocephalic and atraumatic. Neck:  Supple; no masses or thyromegaly. Lungs:  Clear throughout to auscultation, normal respiratory effort.    Heart:  +S1, +S2, Regular rate and rhythm, No edema. Abdomen:  Soft, nontender  and nondistended. Normal bowel sounds, without guarding, and without rebound.   Neurologic:  Alert and  oriented x4;  grossly normal neurologically.  Impression/Plan: Aaron Robinson is here for a colonoscopy to be performed for average risk screening.  Risks, benefits, limitations, and alternatives regarding  colonoscopy have been reviewed with the patient.  Questions have been answered.  All parties agreeable.   Virgel Manifold, MD  07/11/2020, 11:15 AM

## 2020-07-13 NOTE — Anesthesia Postprocedure Evaluation (Signed)
Anesthesia Post Note  Patient: Aaron Robinson  Procedure(s) Performed: COLONOSCOPY WITH PROPOFOL (N/A )  Patient location during evaluation: PACU Anesthesia Type: General Level of consciousness: awake and alert Pain management: pain level controlled Vital Signs Assessment: post-procedure vital signs reviewed and stable Respiratory status: spontaneous breathing, nonlabored ventilation, respiratory function stable and patient connected to nasal cannula oxygen Cardiovascular status: blood pressure returned to baseline and stable Postop Assessment: no apparent nausea or vomiting Anesthetic complications: no   No complications documented.   Last Vitals:  Vitals:   07/11/20 1249 07/11/20 1259  BP: 120/72 113/84  Pulse: 71 69  Resp: (!) 21 13  Temp:    SpO2: 98% 96%    Last Pain:  Vitals:   07/11/20 1259  TempSrc:   PainSc: 0-No pain                 Molli Barrows

## 2020-07-14 ENCOUNTER — Encounter: Payer: Self-pay | Admitting: Gastroenterology

## 2020-07-14 LAB — SURGICAL PATHOLOGY

## 2020-07-16 ENCOUNTER — Encounter: Payer: Self-pay | Admitting: Gastroenterology

## 2020-09-03 LAB — HM DIABETES EYE EXAM

## 2020-11-18 ENCOUNTER — Other Ambulatory Visit: Payer: Self-pay | Admitting: Family Medicine

## 2020-11-18 DIAGNOSIS — F32A Depression, unspecified: Secondary | ICD-10-CM

## 2020-11-18 DIAGNOSIS — I10 Essential (primary) hypertension: Secondary | ICD-10-CM

## 2020-11-18 NOTE — Telephone Encounter (Signed)
Pt is scheduled 8/4

## 2020-11-18 NOTE — Telephone Encounter (Signed)
Notes to clinic:  review for refill  Medication were filled on 10/15/2020 Should have enough until appt Clonazepam is due for refill    Requested Prescriptions  Pending Prescriptions Disp Refills   atenolol (TENORMIN) 100 MG tablet [Pharmacy Med Name: Atenolol Oral Tablet 100 MG] 90 tablet 0    Sig: TAKE ONE TABLET BY MOUTH ONE TIME DAILY      Cardiovascular:  Beta Blockers Passed - 11/18/2020  9:56 AM      Passed - Last BP in normal range    BP Readings from Last 1 Encounters:  07/11/20 113/84          Passed - Last Heart Rate in normal range    Pulse Readings from Last 1 Encounters:  07/11/20 69          Passed - Valid encounter within last 6 months    Recent Outpatient Visits           4 months ago Controlled type 2 diabetes mellitus with microalbuminuria, without long-term current use of insulin ([place])   [place], Megan P, DO   4 months ago Routine general medical examination at a health care facility   [place], [place], DO   11 months ago Essential hypertension   [place], [place] P, DO   1 year ago Routine general medical examination at a health care facility   [facility], [place], DO   1 year ago Moderate episode of recurrent major depressive disorder [facility])   [place], [place], PA-C       Future Appointments             In 1 month Johnson, Megan P, DO [facility], [facility]   In 7 months Johnson, Megan P, DO [facility], [facility]               benazepril (LOTENSIN) 10 MG tablet [employer] Med Name: Benazepril HCl Oral Tablet 10 MG] 90 tablet 0    Sig: TAKE ONE TABLET BY MOUTH ONE TIME DAILY      Cardiovascular:  ACE Inhibitors Passed - 11/18/2020  9:56 AM      Passed - Cr in normal range and within 180 days    Creatinine, Ser  Date Value Ref Range Status  06/25/2020 0.85 0.76 - 1.27 mg/dL Final          Passed  - K in normal range and within 180 days    Potassium  Date Value Ref Range Status  06/25/2020 4.4 3.5 - 5.2 mmol/L Final          Passed - Patient is not pregnant      Passed - Last BP in normal range    BP Readings from Last 1 Encounters:  07/11/20 113/84          Passed - Valid encounter within last 6 months    Recent Outpatient Visits           4 months ago Controlled type 2 diabetes mellitus with microalbuminuria, without long-term current use of insulin ([place])   [place], Megan P, DO   4 months ago Routine general medical examination at a health care facility   [facility], [place], DO   11 months ago Essential hypertension   [place], Megan P, DO   1 year ago Routine general medical examination at a health care facility  [place], Megan P, DO   1 year ago Moderate episode of recurrent major depressive disorder [facility])   [place], Rachel [place], [place]       Future Appointments             In 1 month Johnson, Megan P, DO [facility], [facility]   In 7 months Johnson, Megan P, DO [facility], [facility]               clonazePAM (KLONOPIN) 1 MG tablet [employer] Med Name: clonazePAM Oral Tablet 1 MG] 30 tablet 0    Sig: take 1/2 tablet by mouth each morning and 1/2 tablet as needed      Not Delegated - Psychiatry:  Anxiolytics/Hypnotics Failed - 11/18/2020  9:56 AM      Failed - This refill cannot be delegated      Failed - Urine Drug Screen completed in last 360 days      Passed - Valid encounter within last 6 months    Recent Outpatient Visits           4 months ago Controlled type 2 diabetes mellitus with microalbuminuria, without long-term current use of insulin ([place])   [place], Megan P, DO   4 months ago Routine general medical examination at a health care facility   [facility],  [place] P, DO   11 months ago Essential hypertension   [place], [place] P, DO   1 year ago Routine general medical examination at a health care facility   [facility] [place] P, DO   1 year ago Moderate episode of recurrent major depressive disorder [facility])   [place], Rachel [place], PA-C       Future Appointments             In 1 month Wynetta Emery, Barb Merino, DO [facility], [place]   In 7 months Wynetta Emery, Barb Merino, DO [facility], [facility]

## 2020-11-18 NOTE — Telephone Encounter (Signed)
Last appointment in February we discussed his klonopin lasting 6 months- please see if he has enough to make it to his appointment in August, or if he's out early- if he's needing it more often, will need appointment sooner.

## 2020-11-21 NOTE — Telephone Encounter (Signed)
Attempted to reach patient. No answer and no VM. Will try to call again later.

## 2020-11-25 NOTE — Telephone Encounter (Signed)
Called and left a message for patient to return my call to discuss this.

## 2020-11-25 NOTE — Telephone Encounter (Signed)
Patient called in to inform Dr Wynetta Emery that he does not need the Clonazepam right now but does need his other medications please advise

## 2020-12-25 ENCOUNTER — Other Ambulatory Visit: Payer: Self-pay

## 2020-12-25 ENCOUNTER — Ambulatory Visit (INDEPENDENT_AMBULATORY_CARE_PROVIDER_SITE_OTHER): Payer: 59 | Admitting: Family Medicine

## 2020-12-25 ENCOUNTER — Encounter: Payer: Self-pay | Admitting: Family Medicine

## 2020-12-25 VITALS — BP 124/79 | HR 76 | Temp 98.5°F | Ht 70.9 in | Wt 285.8 lb

## 2020-12-25 DIAGNOSIS — E785 Hyperlipidemia, unspecified: Secondary | ICD-10-CM

## 2020-12-25 DIAGNOSIS — I10 Essential (primary) hypertension: Secondary | ICD-10-CM

## 2020-12-25 DIAGNOSIS — E118 Type 2 diabetes mellitus with unspecified complications: Secondary | ICD-10-CM

## 2020-12-25 DIAGNOSIS — K219 Gastro-esophageal reflux disease without esophagitis: Secondary | ICD-10-CM

## 2020-12-25 DIAGNOSIS — F419 Anxiety disorder, unspecified: Secondary | ICD-10-CM

## 2020-12-25 DIAGNOSIS — F331 Major depressive disorder, recurrent, moderate: Secondary | ICD-10-CM | POA: Diagnosis not present

## 2020-12-25 DIAGNOSIS — F32A Depression, unspecified: Secondary | ICD-10-CM

## 2020-12-25 DIAGNOSIS — Z8249 Family history of ischemic heart disease and other diseases of the circulatory system: Secondary | ICD-10-CM

## 2020-12-25 DIAGNOSIS — Z1283 Encounter for screening for malignant neoplasm of skin: Secondary | ICD-10-CM

## 2020-12-25 LAB — BAYER DCA HB A1C WAIVED: HB A1C (BAYER DCA - WAIVED): 7.8 % — ABNORMAL HIGH (ref <7.0)

## 2020-12-25 MED ORDER — CLONAZEPAM 1 MG PO TABS: 1.0000 mg | ORAL_TABLET | Freq: Every day | ORAL | 1 refills | Status: DC | PRN

## 2020-12-25 MED ORDER — BENAZEPRIL HCL 10 MG PO TABS: 10.0000 mg | ORAL_TABLET | Freq: Every day | ORAL | 1 refills | Status: DC

## 2020-12-25 MED ORDER — DULOXETINE HCL 60 MG PO CPEP: 60.0000 mg | ORAL_CAPSULE | Freq: Every day | ORAL | 3 refills | Status: DC

## 2020-12-25 MED ORDER — ATENOLOL 100 MG PO TABS: 100.0000 mg | ORAL_TABLET | Freq: Every day | ORAL | 1 refills | Status: DC

## 2020-12-25 MED ORDER — ESOMEPRAZOLE MAGNESIUM 40 MG PO CPDR: 40.0000 mg | DELAYED_RELEASE_CAPSULE | Freq: Two times a day (BID) | ORAL | 3 refills | Status: DC

## 2020-12-25 MED ORDER — ATORVASTATIN CALCIUM 40 MG PO TABS: 40.0000 mg | ORAL_TABLET | Freq: Every day | ORAL | 3 refills | Status: DC

## 2020-12-25 MED ORDER — METFORMIN HCL ER 500 MG PO TB24: ORAL_TABLET | ORAL | 3 refills | Status: DC

## 2020-12-25 NOTE — Assessment & Plan Note (Signed)
Under good control on current regimen. Continue current regimen. Continue to monitor. Call with any concerns. Refills given. 60 pills of clonazepam should last 6 months. Call with any concerns.

## 2020-12-25 NOTE — Assessment & Plan Note (Signed)
Under good control on current regimen. Continue current regimen. Continue to monitor. Call with any concerns. Refills given. Labs drawn today.   

## 2020-12-25 NOTE — Assessment & Plan Note (Addendum)
Had previously not wanted medication. Due to increased A1c will start him on atorvastatin. Recheck 1 month. Call with any concerns.

## 2020-12-25 NOTE — Progress Notes (Signed)
BP 124/79   Pulse 76   Temp 98.5 F (36.9 C) (Oral)   Ht 5' 10.9" (1.801 m)   Wt 285 lb 12.8 oz (129.6 kg)   SpO2 98%   BMI 39.97 kg/m    Subjective:    Patient ID: Aaron Robinson, male    DOB: 01-30-1971, 50 y.o.   MRN: Aaron Robinson  HPI: Aaron Robinson is a 50 y.o. male  Chief Complaint  Patient presents with   Depression   Diabetes    Eye exam requested from Dr. Ellin Mayhew.    Hypertension   Referral    Pt requesting new referral to dermatology, states he usually goes every year for a check up    HYPERTENSION / [place] Satisfied with current treatment? yes Duration of hypertension: chronic BP monitoring frequency: not checking BP medication side effects: no Past BP meds: benazepril, atenolol Duration of hyperlipidemia: chronic Cholesterol medication side effects: not on anything Medication compliance: excellent compliance Aspirin: no Recent stressors: no Recurrent headaches: no Visual changes: no Palpitations: no Dyspnea: no Chest pain: no Lower extremity edema: no Dizzy/lightheaded: no  DIABETES Hypoglycemic episodes:no Polydipsia/polyuria: no Visual disturbance: no Chest pain: no Paresthesias: no Glucose Monitoring: no  Accucheck frequency: Not Checking Taking Insulin?: no Blood Pressure Monitoring: not checking Retinal Examination: Up to Date Foot Exam:  Done today Diabetic Education: Not Completed Pneumovax: declined Influenza: declined Aspirin: no  DEPRESSION Duration: chronic Mood status: controlled Satisfied with current treatment?: yes Symptom severity: mild  Duration of current treatment : chronic Side effects: no Medication compliance: excellent compliance Psychotherapy/counseling: no  Previous psychiatric medications: cymbalta Depressed mood: no Anxious mood: no Anhedonia: no Significant weight loss or gain: no Insomnia: no  Fatigue: no Feelings of worthlessness or guilt: no Impaired concentration/indecisiveness:  no Suicidal ideations: no Hopelessness: no Crying spells: no Depression screen [facility] 2/9 12/25/2020 06/25/2020 12/17/2019 06/18/2019 05/16/2019  Decreased Interest 0 0 '1 2 2  '$ Down, Depressed, Hopeless 0 0 0 1 3  PHQ - 2 Score 0 0 '1 3 5  '$ Altered sleeping 0 0 '1 2 2  '$ Tired, decreased energy 0 0 '1 1 2  '$ Change in appetite 0 0 0 0 1  Feeling bad or failure about yourself  0 0 0 0 1  Trouble concentrating 0 0 0 2 2  Moving slowly or fidgety/restless 0 0 0 0 1  Suicidal thoughts 0 0 0 0 0  PHQ-9 Score 0 0 '3 8 14  '$ Difficult doing work/chores Not difficult at all - - Somewhat difficult Somewhat difficult   GAD 7 : Generalized Anxiety Score 12/25/2020 06/25/2020 06/18/2019 05/16/2019  Nervous, Anxious, on Edge '1 2 3 2  '$ Control/stop worrying '1 2 3 2  '$ Worry too much - different things '1 2 3 2  '$ Trouble relaxing 0 0 2 0  Restless 0 0 0 0  Easily annoyed or irritable 0 0 0 0  Afraid - awful might happen 0 0 1 1  Total GAD 7 Score '3 6 12 7  '$ Anxiety Difficulty Not difficult at all Not difficult at all Somewhat difficult Not difficult at all    Relevant past medical, surgical, family and social history reviewed and updated as indicated. Interim medical history since our last visit reviewed. Allergies and medications reviewed and updated.  Review of Systems  Constitutional: Negative.   Respiratory: Negative.    Cardiovascular: Negative.   Gastrointestinal: Negative.   Musculoskeletal: Negative.   Neurological: Negative.   Psychiatric/Behavioral: Negative.  Per HPI unless specifically indicated above     Objective:    BP 124/79   Pulse 76   Temp 98.5 F (36.9 C) (Oral)   Ht 5' 10.9" (1.801 m)   Wt 285 lb 12.8 oz (129.6 kg)   SpO2 98%   BMI 39.97 kg/m   Wt Readings from Last 3 Encounters:  12/25/20 285 lb 12.8 oz (129.6 kg)  07/11/20 280 lb (127 kg)  06/25/20 284 lb (128.8 kg)    Physical Exam Vitals and nursing note reviewed.  Constitutional:      General: He is not in acute  distress.    Appearance: Normal appearance. He is not ill-appearing, toxic-appearing or diaphoretic.  HENT:     Head: Normocephalic and atraumatic.     Right Ear: External ear normal.     Left Ear: External ear normal.     Nose: Nose normal.     Mouth/Throat:     Mouth: Mucous membranes are moist.     Pharynx: Oropharynx is clear.  Eyes:     General: No scleral icterus.       Right eye: No discharge.        Left eye: No discharge.     Extraocular Movements: Extraocular movements intact.     Conjunctiva/sclera: Conjunctivae normal.     Pupils: Pupils are equal, round, and reactive to light.  Cardiovascular:     Rate and Rhythm: Normal rate and regular rhythm.     Pulses: Normal pulses.     Heart sounds: Normal heart sounds. No murmur heard.   No friction rub. No gallop.  Pulmonary:     Effort: Pulmonary effort is normal. No respiratory distress.     Breath sounds: Normal breath sounds. No stridor. No wheezing, rhonchi or rales.  Chest:     Chest wall: No tenderness.  Musculoskeletal:        General: Normal range of motion.     Cervical back: Normal range of motion and neck supple.  Skin:    General: Skin is warm and dry.     Capillary Refill: Capillary refill takes less than 2 seconds.     Coloration: Skin is not jaundiced or pale.     Findings: No bruising, erythema, lesion or rash.  Neurological:     General: No focal deficit present.     Mental Status: He is alert and oriented to person, place, and time. Mental status is at baseline.  Psychiatric:        Mood and Affect: Mood normal.        Behavior: Behavior normal.        Thought Content: Thought content normal.        Judgment: Judgment normal.    Results for orders placed or performed during the hospital encounter of 07/11/20  Surgical pathology  Result Value Ref Range   SURGICAL PATHOLOGY      SURGICAL PATHOLOGY CASE: [id number] PATIENT: [facility] Surgical Pathology Report     Specimen  Submitted: A. Rectum polyp; cold snare  Clinical History: Screening colonoscopy.  Colon polyp      DIAGNOSIS: A. RECTUM POLYP; COLD SNARE: - HYPERPLASTIC POLYP. - NEGATIVE FOR DYSPLASIA AND MALIGNANCY.   GROSS DESCRIPTION: A. Labeled: Rectum polyp cold snare Received: In formalin Collection time: 12:32 PM on 07/11/2020 Placed into formalin time: 12:32 PM on 07/11/2020 Tissue fragment(s): 1 Size: 0.2 cm Description: Tan polypoid fragment Entirely submitted in 1 cassette.  Final Diagnosis performed by Betsy Pries, MD.  Electronically signed 07/14/2020 9:32:21AM The electronic signature indicates that the named Attending Pathologist has evaluated the specimen Technical component performed at [facility], [address], [place], [place] [address] Lab: [phone] Dir: Rush Farmer, MD, MMM  Professional component performed at [facility], [facility], [place], [place],  [address] Lab: [phone] Dir: Dellia Nims. Reuel Derby, MD       Assessment & Plan:   Problem List Items Addressed This Visit       Cardiovascular and Mediastinum   Hypertension    Under good control on current regimen. Continue current regimen. Continue to monitor. Call with any concerns. Refills given. Labs drawn today.        Relevant Medications   atorvastatin (LIPITOR) 40 MG tablet   atenolol (TENORMIN) 100 MG tablet   benazepril (LOTENSIN) 10 MG tablet   Other Relevant Orders   Comprehensive metabolic panel     Digestive   GERD (gastroesophageal reflux disease)   Relevant Medications   esomeprazole (NEXIUM) 40 MG capsule     Endocrine   Controlled diabetes mellitus type 2 with complications ([place])    Not doing well with a1c of 7.8. Will start metformin and recheck 1 month for tolerance. Call with any concerns.        Relevant Medications   atorvastatin (LIPITOR) 40 MG tablet   metFORMIN (GLUCOPHAGE-XR) 500 MG 24 hr tablet   benazepril (LOTENSIN) 10 MG tablet    Other Relevant Orders   Bayer DCA Hb A1c Waived   Comprehensive metabolic panel   Amb ref to Medical Nutrition Therapy-MNT     Other   Anxiety    Under good control on current regimen. Continue current regimen. Continue to monitor. Call with any concerns. Refills given. 60 pills of clonazepam should last 6 months. Call with any concerns.        Relevant Medications   DULoxetine (CYMBALTA) 60 MG capsule   Hyperlipidemia    Had previously not wanted medication. Due to increased A1c will start him on atorvastatin. Recheck 1 month. Call with any concerns.        Relevant Medications   atorvastatin (LIPITOR) 40 MG tablet   atenolol (TENORMIN) 100 MG tablet   benazepril (LOTENSIN) 10 MG tablet   Other Relevant Orders   Comprehensive metabolic panel   Lipid Panel w/o Chol/HDL Ratio   Depression - Primary    Under good control on current regimen. Continue current regimen. Continue to monitor. Call with any concerns. Refills given. Labs drawn today.       Relevant Medications   clonazePAM (KLONOPIN) 1 MG tablet   DULoxetine (CYMBALTA) 60 MG capsule   Other Relevant Orders   Comprehensive metabolic panel   Morbid obesity ([place])    Due to DM, HTN, HLD, GERD. Encouraged diet and exercise with goal of losing 1-2lbs per week.        Relevant Medications   metFORMIN (GLUCOPHAGE-XR) 500 MG 24 hr tablet   Other Visit Diagnoses     Essential hypertension       Relevant Medications   atorvastatin (LIPITOR) 40 MG tablet   atenolol (TENORMIN) 100 MG tablet   benazepril (LOTENSIN) 10 MG tablet   Family history of heart disease       Would like to see cardiology. Referral generated today.   Relevant Orders   Ambulatory referral to Cardiology   Screening for skin cancer       Would like to see dermatology. Referral generated today.   Relevant  Orders   Ambulatory referral to Dermatology        Follow up plan: Return in about 4 weeks (around 01/22/2021).

## 2020-12-25 NOTE — Assessment & Plan Note (Addendum)
Not doing well with a1c of 7.8. Will start metformin and recheck 1 month for tolerance. Call with any concerns.

## 2020-12-25 NOTE — Assessment & Plan Note (Signed)
Due to DM, HTN, HLD, GERD. Encouraged diet and exercise with goal of losing 1-2lbs per week.

## 2020-12-30 LAB — COMPREHENSIVE METABOLIC PANEL
ALT: 28 IU/L (ref 0–44)
AST: 21 IU/L (ref 0–40)
Albumin/Globulin Ratio: 1.2 (ref 1.2–2.2)
Albumin: 4.1 g/dL (ref 4.0–5.0)
Alkaline Phosphatase: 71 IU/L (ref 44–121)
BUN/Creatinine Ratio: 12 (ref 9–20)
BUN: 11 mg/dL (ref 6–24)
Bilirubin Total: <0.2 mg/dL (ref 0.0–1.2)
CO2: 22 mmol/L (ref 20–29)
Calcium: 9.9 mg/dL (ref 8.7–10.2)
Chloride: 98 mmol/L (ref 96–106)
Creatinine, Ser: 0.95 mg/dL (ref 0.76–1.27)
Globulin, Total: 3.3 g/dL (ref 1.5–4.5)
Glucose: 142 mg/dL — ABNORMAL HIGH (ref 65–99)
Potassium: 4.3 mmol/L (ref 3.5–5.2)
Sodium: 137 mmol/L (ref 134–144)
Total Protein: 7.4 g/dL (ref 6.0–8.5)
eGFR: 98 mL/min/{1.73_m2} (ref >59)

## 2020-12-30 LAB — LIPID PANEL W/O CHOL/HDL RATIO
Cholesterol, Total: 144 mg/dL (ref 100–199)
HDL: 36 mg/dL — ABNORMAL LOW (ref >39)
LDL Chol Calc (NIH): 85 mg/dL (ref 0–99)
Triglycerides: 131 mg/dL (ref 0–149)
VLDL Cholesterol Cal: 23 mg/dL (ref 5–40)

## 2021-01-09 ENCOUNTER — Encounter: Payer: Self-pay | Admitting: *Deleted

## 2021-01-09 ENCOUNTER — Encounter: Payer: 59 | Attending: Family Medicine | Admitting: *Deleted

## 2021-01-09 ENCOUNTER — Telehealth: Payer: Self-pay | Admitting: *Deleted

## 2021-01-09 ENCOUNTER — Encounter: Payer: Self-pay | Admitting: Family Medicine

## 2021-01-09 ENCOUNTER — Other Ambulatory Visit: Payer: Self-pay

## 2021-01-09 VITALS — BP 162/96 | Ht 71.0 in | Wt 289.3 lb

## 2021-01-09 DIAGNOSIS — E119 Type 2 diabetes mellitus without complications: Secondary | ICD-10-CM | POA: Diagnosis not present

## 2021-01-09 DIAGNOSIS — E1165 Type 2 diabetes mellitus with hyperglycemia: Secondary | ICD-10-CM

## 2021-01-09 NOTE — Patient Instructions (Addendum)
Exercise:  Begin walking  for    10-15  minutes   3   days a week and gradually increase to 30 minutes 5 x week  Eat 3 meals day,   1  snack a day Space meals 4-6 hours apart Limit fried foods Avoid sugar sweetened drinks (tea)  Return for appointment on:   Tuesday February 10, 2021 at 8:30 am with Freda Munro (nurse)

## 2021-01-09 NOTE — Telephone Encounter (Signed)
Received phone call from patient and he reports that he checked his blood pressure when he got home and it was 147/87. He reports that it is always higher at the doctor's office but comes down once he leaves. He will continue to monitor at home.

## 2021-01-09 NOTE — Progress Notes (Signed)
Diabetes Self-Management Education  Visit Type: First/Initial  Appt. Start Time: 0835 Appt. End Time: 0945  01/09/2021  [name], identified by name and date of birth, is a 50 y.o. male with a diagnosis of Diabetes: Type 2.   ASSESSMENT  Blood pressure (!) 162/96, height '5\' 11"'$  (1.803 m), weight 289 lb 4.8 oz (131.2 kg). Body mass index is 40.35 kg/m.   Diabetes Self-Management Education - 01/09/21 1138       Visit Information   Visit Type First/Initial      Initial Visit   Diabetes Type Type 2    Are you currently following a meal plan? No    Are you taking your medications as prescribed? Yes    Date Diagnosed 1 month      Health Coping   How would you rate your overall health? Fair      Psychosocial Assessment   Patient Belief/Attitude about Diabetes Afraid   "poor"   Self-care barriers None  Pt was very "anxious" during this appointment due to me checking his blood pressure and blood sugar. He reported that he wish he had taken his anti-anxiety medication this morning.   Self-management support Doctor's office    Patient Concerns Nutrition/Meal planning;Glycemic Control;Weight Control;Medication;Monitoring;Healthy Lifestyle    Special Needs None    Preferred Learning Style Hands on;Auditory    Learning Readiness Ready    How often do you need to have someone help you when you read instructions, pamphlets, or other written materials from your doctor or pharmacy? 1 - Never    What is the last grade level you completed in school? 12      Pre-Education Assessment   Patient understands the diabetes disease and treatment process. Needs Instruction    Patient understands incorporating nutritional management into lifestyle. Needs Instruction    Patient undertands incorporating physical activity into lifestyle. Needs Instruction    Patient understands using medications safely. Needs Instruction    Patient understands monitoring blood glucose, interpreting and  using results Needs Instruction    Patient understands prevention, detection, and treatment of acute complications. Needs Instruction    Patient understands prevention, detection, and treatment of chronic complications. Needs Instruction    Patient understands how to develop strategies to address psychosocial issues. Needs Instruction    Patient understands how to develop strategies to promote health/change behavior. Needs Instruction      Complications   Last HgB A1C per patient/outside source 7.8 %   12/25/2020   How often do you check your blood sugar? Patient declines   BG in the office was 284 mg/dL at 9:30 am - 2 hrs pp. Pt had biscuit and grits from fast food place and 1/2 and 1/2 sweet tea.   Have you had a dilated eye exam in the past 12 months? Yes    Have you had a dental exam in the past 12 months? Yes    Are you checking your feet? No      Dietary Intake   Breakfast eats out daily for breakfast - [place] chicken, egg and cheese biscuit; sausage, egg and cheese biscuit; grits    Lunch eats out daily for lunch - [place] shrimp plate with broccoli and side salad; McDonald's bruger and fries; [employer] hotdogs and fries; [employer]-fil[employer]a sandwich and fries; [place] ACP and chips/salsa    Dinner chicken, beef, occasional cereal and milk, tacos; green beans, broccoli, okra, corn, tomatoes    Beverage(s) water, 1/2 and 1/2 tea  Exercise   Exercise Type ADL's      Patient Education   Previous Diabetes Education No    Disease state  Definition of diabetes, type 1 and 2, and the diagnosis of diabetes;Factors that contribute to the development of diabetes    Nutrition management  Role of diet in the treatment of diabetes and the relationship between the three main macronutrients and blood glucose level;Food label reading, portion sizes and measuring food.;Effects of alcohol on blood glucose and safety factors with consumption of alcohol.;Information on hints to eating out and maintain  blood glucose control.    Physical activity and exercise  Role of exercise on diabetes management, blood pressure control and cardiac health.    Medications Reviewed patients medication for diabetes, action, purpose, timing of dose and side effects.    Monitoring Purpose and frequency of SMBG.;Taught/discussed recording of test results and interpretation of SMBG.;Identified appropriate SMBG and/or A1C goals.    Chronic complications Relationship between chronic complications and blood glucose control    Psychosocial adjustment Role of stress on diabetes;Identified and addressed patients feelings and concerns about diabetes      Individualized Goals (developed by patient)   Reducing Risk Other (comment)   improve blood sugars, decrease medications, prevent diabetes complications, lose weight, lead a healthier lifestyle     Outcomes   Expected Outcomes Demonstrated interest in learning. Expect positive outcomes    Future DMSE 4-6 wks         Individualized Plan for Diabetes Self-Management Training:   Learning Objective:  Patient will have a greater understanding of diabetes self-management. Patient education plan is to attend individual and/or group sessions per assessed needs and concerns.   Plan:   Patient Instructions  Exercise:  Begin walking  for    10-15  minutes   3   days a week and gradually increase to 30 minutes 5 x week  Eat 3 meals day,   1  snack a day Space meals 4-6 hours apart Limit fried foods Avoid sugar sweetened drinks (tea)  Return for appointment on:   Tuesday February 10, 2021 at 8:30 am with Freda Munro (nurse)  Expected Outcomes:  Demonstrated interest in learning. Expect positive outcomes  Education material provided:  General Meal Planning Guidelines Simple Meal Plan Combination Foods   If problems or questions, patient to contact team via:   Johny Drilling, RN, CCM, [place] [phone]  Future DSME appointment: 4-6 wks The patient didn't want to  attend Diabetes classes due to his work schedule. He agreed to attend the 2 Hour Refresher program and his next appointment is scheduled for February 10, 2021 with this nurse. He didn't want to see a dietitian at this time.

## 2021-01-20 ENCOUNTER — Ambulatory Visit (INDEPENDENT_AMBULATORY_CARE_PROVIDER_SITE_OTHER): Payer: 59 | Admitting: Family Medicine

## 2021-01-20 ENCOUNTER — Encounter: Payer: Self-pay | Admitting: Family Medicine

## 2021-01-20 ENCOUNTER — Other Ambulatory Visit: Payer: Self-pay

## 2021-01-20 VITALS — BP 132/81 | HR 67 | Temp 98.6°F | Wt 285.4 lb

## 2021-01-20 DIAGNOSIS — E118 Type 2 diabetes mellitus with unspecified complications: Secondary | ICD-10-CM

## 2021-01-20 MED ORDER — METFORMIN HCL ER 500 MG PO TB24: 500.0000 mg | ORAL_TABLET | Freq: Two times a day (BID) | ORAL | 1 refills | Status: DC

## 2021-01-20 NOTE — Assessment & Plan Note (Signed)
Tolerating the metformin well. Continue current regimen. Continue to monitor. Call with any concerns.

## 2021-01-20 NOTE — Progress Notes (Signed)
BP 132/81   Pulse 67   Temp 98.6 F (37 C) (Oral)   Wt 285 lb 6.4 oz (129.5 kg)   SpO2 97%   BMI 39.81 kg/m    Subjective:    Patient ID: Aaron Robinson, male    DOB: 01/10/1971, 50 y.o.   MRN: [id number]  HPI: [name] is a 50 y.o. male  Chief Complaint  Patient presents with   Diabetes   DIABETES Hypoglycemic episodes:no Polydipsia/polyuria: no Visual disturbance: no Chest pain: no Paresthesias: no Glucose Monitoring: no  Accucheck frequency: Not Checking Taking Insulin?: no Blood Pressure Monitoring: not checking Retinal Examination: Up to Date Foot Exam: Up to Date Diabetic Education: Completed Pneumovax:  Refused Influenza: declined Aspirin: no  Relevant past medical, surgical, family and social history reviewed and updated as indicated. Interim medical history since our last visit reviewed. Allergies and medications reviewed and updated.  Review of Systems  Constitutional: Negative.   Respiratory: Negative.    Cardiovascular: Negative.   Gastrointestinal: Negative.   Musculoskeletal: Negative.   Neurological: Negative.   Psychiatric/Behavioral: Negative.     Per HPI unless specifically indicated above     Objective:    BP 132/81   Pulse 67   Temp 98.6 F (37 C) (Oral)   Wt 285 lb 6.4 oz (129.5 kg)   SpO2 97%   BMI 39.81 kg/m   Wt Readings from Last 3 Encounters:  01/20/21 285 lb 6.4 oz (129.5 kg)  01/09/21 289 lb 4.8 oz (131.2 kg)  12/25/20 285 lb 12.8 oz (129.6 kg)    Physical Exam Vitals and nursing note reviewed.  Constitutional:      General: He is not in acute distress.    Appearance: Normal appearance. He is not ill-appearing, toxic-appearing or diaphoretic.  HENT:     Head: Normocephalic and atraumatic.     Right Ear: External ear normal.     Left Ear: External ear normal.     Nose: Nose normal.     Mouth/Throat:     Mouth: Mucous membranes are moist.     Pharynx: Oropharynx is clear.  Eyes:     General: No  scleral icterus.       Right eye: No discharge.        Left eye: No discharge.     Extraocular Movements: Extraocular movements intact.     Conjunctiva/sclera: Conjunctivae normal.     Pupils: Pupils are equal, round, and reactive to light.  Cardiovascular:     Rate and Rhythm: Normal rate and regular rhythm.     Pulses: Normal pulses.     Heart sounds: Normal heart sounds. No murmur heard.   No friction rub. No gallop.  Pulmonary:     Effort: Pulmonary effort is normal. No respiratory distress.     Breath sounds: Normal breath sounds. No stridor. No wheezing, rhonchi or rales.  Chest:     Chest wall: No tenderness.  Musculoskeletal:        General: Normal range of motion.     Cervical back: Normal range of motion and neck supple.  Skin:    General: Skin is warm and dry.     Capillary Refill: Capillary refill takes less than 2 seconds.     Coloration: Skin is not jaundiced or pale.     Findings: No bruising, erythema, lesion or rash.  Neurological:     General: No focal deficit present.     Mental Status: He is alert and oriented  to person, place, and time. Mental status is at baseline.  Psychiatric:        Mood and Affect: Mood normal.        Behavior: Behavior normal.        Thought Content: Thought content normal.        Judgment: Judgment normal.    Results for orders placed or performed in visit on 01/09/21  HM DIABETES EYE EXAM  Result Value Ref Range   HM Diabetic Eye Exam No Retinopathy No Retinopathy      Assessment & Plan:   Problem List Items Addressed This Visit       Endocrine   Controlled diabetes mellitus type 2 with complications ([place]) - Primary   Relevant Medications   metFORMIN (GLUCOPHAGE-XR) 500 MG 24 hr tablet   Other Relevant Orders   Basic metabolic panel     Follow up plan: Return beginning of November.

## 2021-01-21 LAB — BASIC METABOLIC PANEL
BUN/Creatinine Ratio: 10 (ref 9–20)
BUN: 13 mg/dL (ref 6–24)
CO2: 23 mmol/L (ref 20–29)
Calcium: 9.1 mg/dL (ref 8.7–10.2)
Chloride: 100 mmol/L (ref 96–106)
Creatinine, Ser: 1.24 mg/dL (ref 0.76–1.27)
Glucose: 110 mg/dL — ABNORMAL HIGH (ref 65–99)
Potassium: 3.8 mmol/L (ref 3.5–5.2)
Sodium: 137 mmol/L (ref 134–144)
eGFR: 71 mL/min/{1.73_m2} (ref >59)

## 2021-01-23 ENCOUNTER — Ambulatory Visit: Payer: 59 | Admitting: Family Medicine

## 2021-02-09 ENCOUNTER — Other Ambulatory Visit: Payer: Self-pay | Admitting: Internal Medicine

## 2021-02-09 DIAGNOSIS — R0789 Other chest pain: Secondary | ICD-10-CM

## 2021-02-09 DIAGNOSIS — Z8249 Family history of ischemic heart disease and other diseases of the circulatory system: Secondary | ICD-10-CM

## 2021-02-10 ENCOUNTER — Encounter: Payer: Self-pay | Admitting: *Deleted

## 2021-02-10 ENCOUNTER — Other Ambulatory Visit: Payer: Self-pay

## 2021-02-10 ENCOUNTER — Encounter: Payer: 59 | Attending: Family Medicine | Admitting: *Deleted

## 2021-02-10 VITALS — BP 138/90 | Wt 274.8 lb

## 2021-02-10 DIAGNOSIS — E118 Type 2 diabetes mellitus with unspecified complications: Secondary | ICD-10-CM | POA: Insufficient documentation

## 2021-02-10 DIAGNOSIS — E119 Type 2 diabetes mellitus without complications: Secondary | ICD-10-CM

## 2021-02-10 NOTE — Progress Notes (Signed)
Diabetes Self-Management Education  Visit Type: Follow-up  Appt. Start Time: 0825 Appt. End Time: 0920  02/10/2021  [name], identified by name and date of birth, is a 50 y.o. male with a diagnosis of Diabetes: Type 2.   ASSESSMENT  Blood pressure 138/90, weight 274 lb 12.8 oz (124.6 kg). Body mass index is 38.33 kg/m.   Diabetes Self-Management Education - 02/10/21 1140       Visit Information   Visit Type Follow-up      Initial Visit   Diabetes Type Type 2    Are you taking your medications as prescribed? No   forgets his PM dosage of Metformin - instructed him to take with supper instead of bedtime - but supper is the meal that he skips     Complications   How often do you check your blood sugar? Patient declines   BG in the office today was 132 mg/dL at 8:45 am - fasting.   Have you had a dilated eye exam in the past 12 months? Yes    Have you had a dental exam in the past 12 months? Yes    Are you checking your feet? No      Dietary Intake   Breakfast 2 meals and 0-1 snack/day      Exercise   Exercise Type ADL's      Patient Education   Disease state  Definition of diabetes, type 1 and 2, and the diagnosis of diabetes;Factors that contribute to the development of diabetes    Nutrition management  Role of diet in the treatment of diabetes and the relationship between the three main macronutrients and blood glucose level;Food label reading, portion sizes and measuring food.;Effects of alcohol on blood glucose and safety factors with consumption of alcohol.;Information on hints to eating out and maintain blood glucose control.    Physical activity and exercise  Role of exercise on diabetes management, blood pressure control and cardiac health.    Medications Reviewed patients medication for diabetes, action, purpose, timing of dose and side effects.;Other (comment)   taking Metformin 2 x day as directed by MD   Monitoring Purpose and frequency of  SMBG.;Taught/discussed recording of test results and interpretation of SMBG.;Identified appropriate SMBG and/or A1C goals.;Yearly dilated eye exam    Chronic complications Relationship between chronic complications and blood glucose control    Psychosocial adjustment Role of stress on diabetes;Identified and addressed patients feelings and concerns about diabetes      Individualized Goals (developed by patient)   Nutrition Follow meal plan discussed    Physical Activity Exercise 3-5 times per week;30 minutes per day    Medications take my medication as prescribed      Post-Education Assessment   Patient understands the diabetes disease and treatment process. Demonstrates understanding / competency    Patient understands incorporating nutritional management into lifestyle. Needs Review    Patient undertands incorporating physical activity into lifestyle. Needs Review    Patient understands using medications safely. Needs Review    Patient understands monitoring blood glucose, interpreting and using results Needs Review    Patient understands prevention, detection, and treatment of acute complications. Needs Instruction    Patient understands prevention, detection, and treatment of chronic complications. Demonstrates understanding / competency    Patient understands how to develop strategies to address psychosocial issues. Demonstrates understanding / competency    Patient understands how to develop strategies to promote health/change behavior. Demonstrates understanding / competency      Outcomes  Expected Outcomes Demonstrated interest in learning. Expect positive outcomes    Program Status Completed      Subsequent Visit   Since your last visit have you continued or begun to take your medications as prescribed? Yes    Since your last visit have you had your blood pressure checked? Yes    Is your most recent blood pressure lower, unchanged, or higher since your last visit? Lower    Since  your last visit have you experienced any weight changes? Loss    Weight Loss (lbs) 14.5    Since your last visit, are you checking your blood glucose at least once a day? No        Individualized Plan for Diabetes Self-Management Training:   Learning Objective:  Patient will have a greater understanding of diabetes self-management. Patient education plan is to attend individual and/or group sessions per assessed needs and concerns.   Plan:   Patient Instructions  Exercise:  Begin walking  for    10-15  minutes   3  days a week and gradually increase to 30 minutes 5 x week  Eat 3 meals day,   1-2  snacks a day Space meals 4-6 hours apart Allow 2-3 hours between meals and snacks Don't skip meals - eat at least 1 protein and 1 carbohydrate serving  Continue to avoid sugar sweetened tea Continue to limit fried foods  Expected Outcomes:  Demonstrated interest in learning. Expect positive outcomes  Education material provided:  Planning a Balanced Meal Smart Snacking  If problems or questions, patient to contact team via:   Johny Drilling, RN, [place], [place] [phone]  Future DSME appointment:  PRN

## 2021-02-10 NOTE — Patient Instructions (Signed)
Exercise:  Begin walking  for    10-15  minutes   3  days a week and gradually increase to 30 minutes 5 x week  Eat 3 meals day,   1-2  snacks a day Space meals 4-6 hours apart Allow 2-3 hours between meals and snacks Don't skip meals - eat at least 1 protein and 1 carbohydrate serving  Continue to avoid sugar sweetened tea Continue to limit fired foods

## 2021-02-19 ENCOUNTER — Ambulatory Visit: Admission: RE | Admit: 2021-02-19 | Payer: 59 | Source: Ambulatory Visit

## 2021-03-03 ENCOUNTER — Other Ambulatory Visit (HOSPITAL_COMMUNITY): Payer: Self-pay | Admitting: *Deleted

## 2021-03-03 ENCOUNTER — Other Ambulatory Visit
Admission: RE | Admit: 2021-03-03 | Discharge: 2021-03-03 | Disposition: A | Discharge status: Home / Self Care | Payer: 59 | Attending: Cardiology | Admitting: Cardiology

## 2021-03-03 ENCOUNTER — Telehealth (HOSPITAL_COMMUNITY): Payer: Self-pay | Admitting: *Deleted

## 2021-03-03 DIAGNOSIS — Z01812 Encounter for preprocedural laboratory examination: Secondary | ICD-10-CM | POA: Insufficient documentation

## 2021-03-03 LAB — BASIC METABOLIC PANEL
Anion gap: 8 (ref 5–15)
BUN: 12 mg/dL (ref 6–20)
CO2: 28 mmol/L (ref 22–32)
Calcium: 9 mg/dL (ref 8.9–10.3)
Chloride: 101 mmol/L (ref 98–111)
Creatinine, Ser: 0.84 mg/dL (ref 0.61–1.24)
GFR, Estimated: >60 mL/min (ref >60)
Glucose, Bld: 103 mg/dL — ABNORMAL HIGH (ref 70–99)
Potassium: 3.7 mmol/L (ref 3.5–5.1)
Sodium: 137 mmol/L (ref 135–145)

## 2021-03-03 NOTE — Telephone Encounter (Signed)
Reaching out to patient to offer assistance regarding upcoming cardiac imaging study; pt verbalizes understanding of appt date/time, parking situation and where to check in, pre-test NPO status, and verified current allergies; name and call back number provided for further questions should they arise  Gordy Clement RN [facility] [phone] office [phone] cell  Patient aware to get blood work prior to [place].  Patient will take his morning atenolol 2 hours prior to cardiac CT in addition to his klonopin for anxiety for the test. Patient aware he will need a ride if taking klonopin.  Pt educated about low HR for test and was offered corlanor for better HR control but declined. Patient aware we may not be able to perform test if HR is not adequately lowered. Pt verbalized understanding.

## 2021-03-05 ENCOUNTER — Ambulatory Visit
Admission: RE | Admit: 2021-03-05 | Discharge: 2021-03-05 | Disposition: A | Discharge status: Home / Self Care | Payer: 59 | Source: Ambulatory Visit | Attending: Internal Medicine | Admitting: Internal Medicine

## 2021-03-05 ENCOUNTER — Other Ambulatory Visit: Payer: Self-pay

## 2021-03-05 DIAGNOSIS — Z8249 Family history of ischemic heart disease and other diseases of the circulatory system: Secondary | ICD-10-CM | POA: Diagnosis present

## 2021-03-05 DIAGNOSIS — R0789 Other chest pain: Secondary | ICD-10-CM

## 2021-03-05 MED ORDER — METOPROLOL TARTRATE 5 MG/5ML IV SOLN
10.0000 mg | Freq: Once | INTRAVENOUS | Status: AC
  Administered 2021-03-05: 10 mg via INTRAVENOUS

## 2021-03-05 MED ORDER — NITROGLYCERIN 0.4 MG SL SUBL
0.8000 mg | SUBLINGUAL_TABLET | Freq: Once | SUBLINGUAL | Status: AC
  Administered 2021-03-05: 0.8 mg via SUBLINGUAL

## 2021-03-05 MED ORDER — IOHEXOL 350 MG/ML SOLN
100.0000 mL | Freq: Once | INTRAVENOUS | Status: AC | PRN
  Administered 2021-03-05: 100 mL via INTRAVENOUS

## 2021-03-05 NOTE — Progress Notes (Signed)
Patient tolerated CT well. Drank water after. Vital signs stable encourage to drink water throughout day.Reasons explained and verbalized understanding. Ambulated steady gait.  

## 2021-03-18 ENCOUNTER — Ambulatory Visit (INDEPENDENT_AMBULATORY_CARE_PROVIDER_SITE_OTHER): Payer: 59 | Admitting: Dermatology

## 2021-03-18 ENCOUNTER — Other Ambulatory Visit: Payer: Self-pay

## 2021-03-18 DIAGNOSIS — L578 Other skin changes due to chronic exposure to nonionizing radiation: Secondary | ICD-10-CM

## 2021-03-18 DIAGNOSIS — L719 Rosacea, unspecified: Secondary | ICD-10-CM | POA: Diagnosis not present

## 2021-03-18 DIAGNOSIS — D2271 Melanocytic nevi of right lower limb, including hip: Secondary | ICD-10-CM | POA: Diagnosis not present

## 2021-03-18 DIAGNOSIS — L814 Other melanin hyperpigmentation: Secondary | ICD-10-CM

## 2021-03-18 DIAGNOSIS — D229 Melanocytic nevi, unspecified: Secondary | ICD-10-CM | POA: Diagnosis not present

## 2021-03-18 DIAGNOSIS — Z1283 Encounter for screening for malignant neoplasm of skin: Secondary | ICD-10-CM

## 2021-03-18 DIAGNOSIS — D485 Neoplasm of uncertain behavior of skin: Secondary | ICD-10-CM

## 2021-03-18 DIAGNOSIS — D1800 Hemangioma unspecified site: Secondary | ICD-10-CM

## 2021-03-18 DIAGNOSIS — L918 Other hypertrophic disorders of the skin: Secondary | ICD-10-CM

## 2021-03-18 DIAGNOSIS — L821 Other seborrheic keratosis: Secondary | ICD-10-CM

## 2021-03-18 NOTE — Patient Instructions (Addendum)
Wound Care Instructions  Cleanse wound gently with soap and water once a day then pat dry with clean gauze. Apply a thing coat of Petrolatum (petroleum jelly, "Vaseline") over the wound (unless you have an allergy to this). We recommend that you use a new, sterile tube of Vaseline. Do not pick or remove scabs. Do not remove the yellow or white "healing tissue" from the base of the wound.  Cover the wound with fresh, clean, nonstick gauze and secure with paper tape. You may use Band-Aids in place of gauze and tape if the would is small enough, but would recommend trimming much of the tape off as there is often too much. Sometimes Band-Aids can irritate the skin.  You should call the office for your biopsy report after 1 week if you have not already been contacted.  If you experience any problems, such as abnormal amounts of bleeding, swelling, significant bruising, significant pain, or evidence of infection, please call the office immediately.  FOR ADULT SURGERY PATIENTS: If you need something for pain relief you may take 1 extra strength Tylenol (acetaminophen) AND 2 Ibuprofen (200mg each) together every 4 hours as needed for pain. (do not take these if you are allergic to them or if you have a reason you should not take them.) Typically, you may only need pain medication for 1 to 3 days.   If you have any questions or concerns for your doctor, please call our main line at [phone] and press option 4 to reach your doctor's medical assistant. If no one answers, please leave a voicemail as directed and we will return your call as soon as possible. Messages left after 4 pm will be answered the following business day.   You may also send us a message via MyChart. We typically respond to MyChart messages within 1-2 business days.  For prescription refills, please ask your pharmacy to contact our office. Our fax number is [phone].  If you have an urgent issue when the clinic is closed that  cannot wait until the next business day, you can page your doctor at the number below.    Please note that while we do our best to be available for urgent issues outside of office hours, we are not available 24/7.   If you have an urgent issue and are unable to reach us, you may choose to seek medical care at your doctor's office, retail clinic, urgent care center, or emergency room.  If you have a medical emergency, please immediately call 911 or go to the emergency department.  Pager Numbers  - Dr. Kowalski: [phone]  - Dr. Moye: [phone]  - Dr. Stewart: [phone]  In the event of inclement weather, please call our main line at [phone] for an update on the status of any delays or closures.  Dermatology Medication Tips: Please keep the boxes that topical medications come in in order to help keep track of the instructions about where and how to use these. Pharmacies typically print the medication instructions only on the boxes and not directly on the medication tubes.   If your medication is too expensive, please contact our office at [phone] option 4 or send us a message through MyChart.   We are unable to tell what your co-pay for medications will be in advance as this is different depending on your insurance coverage. However, we may be able to find a substitute medication at lower cost or fill out paperwork to get insurance to cover a needed   medication.   If a prior authorization is required to get your medication covered by your insurance company, please allow us 1-2 business days to complete this process.  Drug prices often vary depending on where the prescription is filled and some pharmacies may offer cheaper prices.  The website www.goodrx.com contains coupons for medications through different pharmacies. The prices here do not account for what the cost may be with help from insurance (it may be cheaper with your insurance), but the website can give you the  price if you did not use any insurance.  - You can print the associated coupon and take it with your prescription to the pharmacy.  - You may also stop by our office during regular business hours and pick up a GoodRx coupon card.  - If you need your prescription sent electronically to a different pharmacy, notify our office through [facility] MyChart or by phone at [phone] option 4.   

## 2021-03-18 NOTE — Progress Notes (Signed)
New Patient Visit  Subjective  Aaron Robinson is a 50 y.o. male who presents for the following: Annual Exam (No new or changing moles, lesions, or spots.). The patient presents for Total-Body Skin Exam (TBSE) for skin cancer screening and mole check. he has several areas to be evaluated.  he has redness of his face.  The following portions of the chart were reviewed this encounter and updated as appropriate:   Tobacco  Allergies  Meds  Problems  Med Hx  Surg Hx  Fam Hx     Review of Systems:  No other skin or systemic complaints except as noted in HPI or Assessment and Plan.  Objective  Well appearing patient in no apparent distress; mood and affect are within normal limits.  A full examination was performed including scalp, head, eyes, ears, nose, lips, neck, chest, axillae, abdomen, back, buttocks, bilateral upper extremities, bilateral lower extremities, hands, feet, fingers, toes, fingernails, and toenails. All findings within normal limits unless otherwise noted below.  Face Mid face erythema with telangiectasias +/- scattered inflammatory papules.   R ant thigh Irregular brown macule 0.8 cm      Assessment & Plan  Rosacea Face  Rosacea is a chronic progressive skin condition usually affecting the face of adults, causing redness and/or acne bumps. It is treatable but not curable. It sometimes affects the eyes (ocular rosacea) as well. It may respond to topical and/or systemic medication and can flare with stress, sun exposure, alcohol, exercise and some foods.  Daily application of broad spectrum spf 30+ sunscreen to face is recommended to reduce flares. Discussed topicals orals and laser BBL treatments.  Patient declines at this time.  Neoplasm of uncertain behavior of skin R ant thigh Epidermal / dermal shaving  Lesion diameter (cm):  0.8 Informed consent: discussed and consent obtained   Timeout: patient name, date of birth, surgical site, and procedure  verified   Procedure prep:  Patient was prepped and draped in usual sterile fashion Prep type:  Isopropyl alcohol Anesthesia: the lesion was anesthetized in a standard fashion   Anesthetic:  1% lidocaine w/ epinephrine 1-100,000 buffered w/ 8.4% NaHCO3 Instrument used: flexible razor blade   Hemostasis achieved with: pressure, aluminum chloride and electrodesiccation   Outcome: patient tolerated procedure well   Post-procedure details: sterile dressing applied and wound care instructions given   Dressing type: bandage and petrolatum    Specimen 1 - Surgical pathology Differential Diagnosis: D48.5 r/o dysplastic nevus  Check Margins: No  Skin cancer screening  Lentigines - Scattered tan macules - Due to sun exposure - Benign-appearing, observe - Recommend daily broad spectrum sunscreen SPF 30+ to sun-exposed areas, reapply every 2 hours as needed. - Call for any changes  Seborrheic Keratoses - Stuck-on, waxy, tan-brown papules and/or plaques  - Benign-appearing - Discussed benign etiology and prognosis. - Observe - Call for any changes  Melanocytic Nevi - Tan-brown and/or pink-flesh-colored symmetric macules and papules - Benign appearing on exam today - Observation - Call clinic for new or changing moles - Recommend daily use of broad spectrum spf 30+ sunscreen to sun-exposed areas.   Hemangiomas - Red papules - Discussed benign nature - Observe - Call for any changes  Actinic Damage - Chronic condition, secondary to cumulative UV/sun exposure - diffuse scaly erythematous macules with underlying dyspigmentation - Recommend daily broad spectrum sunscreen SPF 30+ to sun-exposed areas, reapply every 2 hours as needed.  - Staying in the shade or wearing long sleeves, sun glasses (UVA+UVB  protection) and wide brim hats (4-inch brim around the entire circumference of the hat) are also recommended for sun protection.  - Call for new or changing lesions.  Acrochordons (Skin  Tags) - Fleshy, skin-colored pedunculated papules - Benign appearing.  - Observe. - If desired, they can be removed with an in office procedure that is not covered by insurance. - Please call the clinic if you notice any new or changing lesions.  Skin cancer screening performed today.  Return if symptoms worsen or fail to improve.  Luther Redo, CMA, am acting as scribe for Sarina Ser, MD .  Documentation: I have reviewed the above documentation for accuracy and completeness, and I agree with the above.  Sarina Ser, MD

## 2021-03-19 ENCOUNTER — Encounter: Payer: Self-pay | Admitting: Dermatology

## 2021-03-20 ENCOUNTER — Telehealth: Payer: Self-pay

## 2021-03-20 NOTE — Telephone Encounter (Signed)
-----   Message from Ralene Bathe, MD sent at 03/19/2021  5:23 PM EDT ----- Diagnosis Skin , right ant thigh MELANOCYTIC NEVUS, COMPOUND CONGENITAL TYPE, IRRITATED  Benign irritated birth mark type mole No further treatment needed

## 2021-03-20 NOTE — Telephone Encounter (Signed)
Patient informed of pathology results 

## 2021-03-31 ENCOUNTER — Other Ambulatory Visit: Payer: Self-pay

## 2021-03-31 ENCOUNTER — Encounter: Payer: Self-pay | Admitting: Family Medicine

## 2021-03-31 ENCOUNTER — Ambulatory Visit (INDEPENDENT_AMBULATORY_CARE_PROVIDER_SITE_OTHER): Payer: 59 | Admitting: Family Medicine

## 2021-03-31 VITALS — BP 132/82 | HR 72 | Temp 98.4°F | Wt 272.4 lb

## 2021-03-31 DIAGNOSIS — E118 Type 2 diabetes mellitus with unspecified complications: Secondary | ICD-10-CM

## 2021-03-31 DIAGNOSIS — F419 Anxiety disorder, unspecified: Secondary | ICD-10-CM

## 2021-03-31 LAB — BAYER DCA HB A1C WAIVED: HB A1C (BAYER DCA - WAIVED): 5.9 % — ABNORMAL HIGH (ref 4.8–5.6)

## 2021-03-31 MED ORDER — ATORVASTATIN CALCIUM 40 MG PO TABS: 40.0000 mg | ORAL_TABLET | Freq: Every day | ORAL | 1 refills | Status: DC

## 2021-03-31 NOTE — Progress Notes (Signed)
BP 132/82   Pulse 72   Temp 98.4 F (36.9 C) (Oral)   Wt 272 lb 6.4 oz (123.6 kg)   SpO2 96%   BMI 37.99 kg/m    Subjective:    Patient ID: Aaron Robinson, male    DOB: 07-27-1970, 50 y.o.   MRN: [id number]  HPI: Aaron Robinson is a 50 y.o. male  Chief Complaint  Patient presents with   Depression   Diabetes   Hypertension   DIABETES Hypoglycemic episodes:no Polydipsia/polyuria: no Visual disturbance: no Chest pain: no Paresthesias: no Glucose Monitoring: no  Accucheck frequency: Not Checking Taking Insulin?: no Blood Pressure Monitoring: not checking Retinal Examination: Up to Date Foot Exam: Up to Date Diabetic Education: Completed Pneumovax: Up to Date Influenza: Up to Date Aspirin: yes  ANXIETY/STRESS Duration: Chronic Status:controlled Anxious mood: yes  Excessive worrying: yes Irritability: no  Sweating: no Nausea: no Palpitations:no Hyperventilation: no Panic attacks: no Agoraphobia: no  Obscessions/compulsions: no Depressed mood: no Depression screen [facility] 2/9 03/31/2021 01/20/2021 01/09/2021 12/25/2020 06/25/2020  Decreased Interest 0 0 0 0 0  Down, Depressed, Hopeless 0 0 0 0 0  PHQ - 2 Score 0 0 0 0 0  Altered sleeping 0 0 - 0 0  Tired, decreased energy 0 0 - 0 0  Change in appetite 0 0 - 0 0  Feeling bad or failure about yourself  0 0 - 0 0  Trouble concentrating 0 0 - 0 0  Moving slowly or fidgety/restless 0 0 - 0 0  Suicidal thoughts 0 0 - 0 0  PHQ-9 Score 0 0 - 0 0  Difficult doing work/chores Not difficult at all Not difficult at all - Not difficult at all -  Some recent data might be hidden   Anhedonia: no Weight changes: no Insomnia: no   Hypersomnia: no Fatigue/loss of energy: no Feelings of worthlessness: no Feelings of guilt: no Impaired concentration/indecisiveness: no Suicidal ideations: no  Crying spells: no Recent Stressors/Life Changes: no   Relationship problems: no   Family stress: no     Financial stress:  no    Job stress: no    Recent death/loss: no  Relevant past medical, surgical, family and social history reviewed and updated as indicated. Interim medical history since our last visit reviewed. Allergies and medications reviewed and updated.  Review of Systems  Constitutional: Negative.   Respiratory: Negative.    Cardiovascular: Negative.   Gastrointestinal: Negative.   Musculoskeletal: Negative.   Neurological: Negative.   Psychiatric/Behavioral: Negative.     Per HPI unless specifically indicated above     Objective:    BP 132/82   Pulse 72   Temp 98.4 F (36.9 C) (Oral)   Wt 272 lb 6.4 oz (123.6 kg)   SpO2 96%   BMI 37.99 kg/m   Wt Readings from Last 3 Encounters:  03/31/21 272 lb 6.4 oz (123.6 kg)  02/10/21 274 lb 12.8 oz (124.6 kg)  01/20/21 285 lb 6.4 oz (129.5 kg)    Physical Exam Vitals and nursing note reviewed.  Constitutional:      General: He is not in acute distress.    Appearance: Normal appearance. He is not ill-appearing, toxic-appearing or diaphoretic.  HENT:     Head: Normocephalic and atraumatic.     Right Ear: External ear normal.     Left Ear: External ear normal.     Nose: Nose normal.     Mouth/Throat:     Mouth: Mucous membranes are  moist.     Pharynx: Oropharynx is clear.  Eyes:     General: No scleral icterus.       Right eye: No discharge.        Left eye: No discharge.     Extraocular Movements: Extraocular movements intact.     Conjunctiva/sclera: Conjunctivae normal.     Pupils: Pupils are equal, round, and reactive to light.  Cardiovascular:     Rate and Rhythm: Normal rate and regular rhythm.     Pulses: Normal pulses.     Heart sounds: Normal heart sounds. No murmur heard.   No friction rub. No gallop.  Pulmonary:     Effort: Pulmonary effort is normal. No respiratory distress.     Breath sounds: Normal breath sounds. No stridor. No wheezing, rhonchi or rales.  Chest:     Chest wall: No tenderness.  Musculoskeletal:         General: Normal range of motion.     Cervical back: Normal range of motion and neck supple.  Skin:    General: Skin is warm and dry.     Capillary Refill: Capillary refill takes less than 2 seconds.     Coloration: Skin is not jaundiced or pale.     Findings: No bruising, erythema, lesion or rash.  Neurological:     General: No focal deficit present.     Mental Status: He is alert and oriented to person, place, and time. Mental status is at baseline.  Psychiatric:        Mood and Affect: Mood normal.        Behavior: Behavior normal.        Thought Content: Thought content normal.        Judgment: Judgment normal.    Results for orders placed or performed in visit on 03/31/21  Bayer DCA Hb A1c Waived  Result Value Ref Range   HB A1C (BAYER DCA - WAIVED) 5.9 (H) 4.8 - 5.6 %      Assessment & Plan:   Problem List Items Addressed This Visit       Endocrine   Controlled diabetes mellitus type 2 with complications ([place]) - Primary    Doing well with A1c of 5.9. Continue current regimen. Continue to monitor. If still good in 3 months, will cut down to 500mg  daily. Call with any concerns.       Relevant Medications   atorvastatin (LIPITOR) 40 MG tablet   Other Relevant Orders   Bayer DCA Hb A1c Waived (Completed)     Other   Anxiety    Under good control on current regimen. Continue current regimen. Continue to monitor. Call with any concerns. Refills up to date.          Follow up plan: Return in about 3 months (around 07/01/2021).

## 2021-03-31 NOTE — Assessment & Plan Note (Signed)
Under good control on current regimen. Continue current regimen. Continue to monitor. Call with any concerns. Refills up to date.   

## 2021-03-31 NOTE — Assessment & Plan Note (Signed)
Doing well with A1c of 5.9. Continue current regimen. Continue to monitor. If still good in 3 months, will cut down to 500mg  daily. Call with any concerns.

## 2021-06-30 ENCOUNTER — Encounter: Payer: 59 | Admitting: Family Medicine

## 2021-07-03 ENCOUNTER — Ambulatory Visit: Payer: 59 | Admitting: Family Medicine

## 2021-07-07 ENCOUNTER — Other Ambulatory Visit: Payer: Self-pay

## 2021-07-07 ENCOUNTER — Ambulatory Visit (INDEPENDENT_AMBULATORY_CARE_PROVIDER_SITE_OTHER): Payer: 59 | Admitting: Dermatology

## 2021-07-07 DIAGNOSIS — L918 Other hypertrophic disorders of the skin: Secondary | ICD-10-CM

## 2021-07-07 NOTE — Progress Notes (Signed)
° °  Follow-Up Visit   Subjective  Aaron Robinson is a 51 y.o. male who presents for the following: Skin Tag (Patient is here today for skin tag removal for lesions at the neck and axilla.).  The following portions of the chart were reviewed this encounter and updated as appropriate:   Tobacco   Allergies   Meds   Problems   Med Hx   Surg Hx   Fam Hx      Review of Systems:  No other skin or systemic complaints except as noted in HPI or Assessment and Plan.  Objective  Well appearing patient in no apparent distress; mood and affect are within normal limits.  A focused examination was performed including the axilla, trunk, and neck. Relevant physical exam findings are noted in the Assessment and Plan.  R axilla x 6, neck x 1, L axilla x 6 (13) Fleshy, skin-colored pedunculated papules.     Assessment & Plan  Skin tag (13) R axilla x 6, neck x 1, L axilla x 6  Acrochordons (Skin Tags) - Removal desired by patient - Fleshy, skin-colored pedunculated papules - Benign appearing.  - Patient desires removal. Reviewed that this is not covered by insurance and they will be charged a cosmetic fee for removal. Patient signed non-covered consent.  - Prior to the procedure, reviewed the expected small wound. Also reviewed the risk of leaving a small scar and the small risk of infection.  PROCEDURE - The areas were prepped with isopropyl alcohol. A small amount of lidocaine 1% with epinephrine was injected at the base of each lesion to achieve good local anesthesia. The skin tags were removed using a snip technique. Aluminum chloride was used for hemostasis. Petrolatum and a bandage were applied. The procedure was tolerated well. - Wound care was reviewed with the patient. They were advised to call with any concerns. Total number of treated acrochordons 13    Return for appointment as scheduled.  Luther Redo, CMA, am acting as scribe for Sarina Ser, MD . Documentation: I have  reviewed the above documentation for accuracy and completeness, and I agree with the above.  Sarina Ser, MD

## 2021-07-07 NOTE — Patient Instructions (Signed)

## 2021-07-10 ENCOUNTER — Encounter: Payer: Self-pay | Admitting: Dermatology

## 2021-07-20 ENCOUNTER — Encounter: Payer: Self-pay | Admitting: Family Medicine

## 2021-07-20 ENCOUNTER — Ambulatory Visit (INDEPENDENT_AMBULATORY_CARE_PROVIDER_SITE_OTHER): Payer: 59 | Admitting: Family Medicine

## 2021-07-20 ENCOUNTER — Other Ambulatory Visit: Payer: Self-pay

## 2021-07-20 VITALS — BP 132/87 | HR 70 | Temp 98.0°F | Ht 70.0 in | Wt 274.2 lb

## 2021-07-20 DIAGNOSIS — Z0000 Encounter for general adult medical examination without abnormal findings: Secondary | ICD-10-CM | POA: Diagnosis not present

## 2021-07-20 DIAGNOSIS — E785 Hyperlipidemia, unspecified: Secondary | ICD-10-CM | POA: Diagnosis not present

## 2021-07-20 DIAGNOSIS — M4722 Other spondylosis with radiculopathy, cervical region: Secondary | ICD-10-CM

## 2021-07-20 DIAGNOSIS — I10 Essential (primary) hypertension: Secondary | ICD-10-CM | POA: Diagnosis not present

## 2021-07-20 DIAGNOSIS — K219 Gastro-esophageal reflux disease without esophagitis: Secondary | ICD-10-CM

## 2021-07-20 DIAGNOSIS — E118 Type 2 diabetes mellitus with unspecified complications: Secondary | ICD-10-CM | POA: Diagnosis not present

## 2021-07-20 DIAGNOSIS — F331 Major depressive disorder, recurrent, moderate: Secondary | ICD-10-CM

## 2021-07-20 LAB — MICROSCOPIC EXAMINATION
Bacteria, UA: NONE SEEN
Epithelial Cells (non renal): NONE SEEN /hpf (ref 0–10)
RBC, Urine: NONE SEEN /hpf (ref 0–2)
WBC, UA: NONE SEEN /hpf (ref 0–5)

## 2021-07-20 LAB — URINALYSIS, ROUTINE W REFLEX MICROSCOPIC
Bilirubin, UA: NEGATIVE
Glucose, UA: NEGATIVE
Ketones, UA: NEGATIVE
Leukocytes,UA: NEGATIVE
Nitrite, UA: NEGATIVE
RBC, UA: NEGATIVE
Specific Gravity, UA: >1.03 — ABNORMAL HIGH (ref 1.005–1.030)
Urobilinogen, Ur: 1 mg/dL (ref 0.2–1.0)
pH, UA: 5.5 (ref 5.0–7.5)

## 2021-07-20 LAB — MICROALBUMIN, URINE WAIVED
Creatinine, Urine Waived: 300 mg/dL (ref 10–300)
Microalb, Ur Waived: 80 mg/L — ABNORMAL HIGH (ref 0–19)

## 2021-07-20 LAB — BAYER DCA HB A1C WAIVED: HB A1C (BAYER DCA - WAIVED): 6.4 % — ABNORMAL HIGH (ref 4.8–5.6)

## 2021-07-20 NOTE — Assessment & Plan Note (Signed)
Under good control on current regimen. Continue current regimen. Continue to monitor. Call with any concerns. Refills given. Labs drawn today.   

## 2021-07-20 NOTE — Progress Notes (Signed)
BP 132/87    Pulse 70    Temp 98 F (36.7 C)    Ht 5\' 10"  (1.778 m)    Wt 274 lb 3.2 oz (124.4 kg)    SpO2 97%    BMI 39.34 kg/m    Subjective:    Patient ID: Aaron Robinson, male    DOB: 1971-02-24, 51 y.o.   MRN: [id number]  HPI: Aaron Robinson is a 51 y.o. male presenting on 07/20/2021 for comprehensive medical examination. Current medical complaints include:  NUMBNESS Duration: about a month Onset: gradual Location: R thumb Bilateral: no Symmetric: no Decreased sensation: yes  Weakness: no Pain: no Quality:  numb Severity: moderate  Frequency: intermittent Trauma: no Recent illness: no Diabetes: no Thyroid disease: no  HIV: no  Alcoholism: no  Spinal cord injury: no Status:  fluctuating  HYPERTENSION / HYPERLIPIDEMIA Satisfied with current treatment? yes Duration of hypertension: chronic BP monitoring frequency: not checking BP medication side effects: no Past BP meds: ateolol, benazepril Duration of hyperlipidemia: chronic Cholesterol medication side effects: no Cholesterol supplements: none Past cholesterol medications: atorvastatin Medication compliance: excellent compliance Aspirin: no Recent stressors: yes Recurrent headaches: no Visual changes: no Palpitations: no Dyspnea: no Chest pain: no Lower extremity edema: no Dizzy/lightheaded: no  DIABETES Hypoglycemic episodes:no Polydipsia/polyuria: no Visual disturbance: no Chest pain: no Paresthesias: no Glucose Monitoring: no  Accucheck frequency: Not Checking Taking Insulin?: no Blood Pressure Monitoring: not checking Retinal Examination: Up to Date Foot Exam: Up to Date Diabetic Education: Completed Pneumovax: Up to Date Influenza: Up to Date Aspirin: no  ANXIETY/DEPRESSION Duration: chronic Status:stable Anxious mood: yes  Excessive worrying: yes Irritability: no  Sweating: no Nausea: no Palpitations:no Hyperventilation: no Panic attacks: no Agoraphobia: no   Obscessions/compulsions: no Depressed mood: yes Depression screen [facility] 2/9 07/20/2021 03/31/2021 01/20/2021 01/09/2021 12/25/2020  Decreased Interest 1 0 0 0 0  Down, Depressed, Hopeless 0 0 0 0 0  PHQ - 2 Score 1 0 0 0 0  Altered sleeping 1 0 0 - 0  Tired, decreased energy 1 0 0 - 0  Change in appetite 0 0 0 - 0  Feeling bad or failure about yourself  0 0 0 - 0  Trouble concentrating 0 0 0 - 0  Moving slowly or fidgety/restless 0 0 0 - 0  Suicidal thoughts 0 0 0 - 0  PHQ-9 Score 3 0 0 - 0  Difficult doing work/chores - Not difficult at all Not difficult at all - Not difficult at all  Some recent data might be hidden   Anhedonia: no Weight changes: no Insomnia: no   Hypersomnia: no Fatigue/loss of energy: yes Feelings of worthlessness: no Feelings of guilt: no Impaired concentration/indecisiveness: no Suicidal ideations: no  Crying spells: no Recent Stressors/Life Changes: no   Relationship problems: no   Family stress: no     Financial stress: no    Job stress: no    Recent death/loss: no  Interim Problems from his last visit: no  Depression Screen done today and results listed below:  Depression screen [facility] 2/9 07/20/2021 03/31/2021 01/20/2021 01/09/2021 12/25/2020  Decreased Interest 1 0 0 0 0  Down, Depressed, Hopeless 0 0 0 0 0  PHQ - 2 Score 1 0 0 0 0  Altered sleeping 1 0 0 - 0  Tired, decreased energy 1 0 0 - 0  Change in appetite 0 0 0 - 0  Feeling bad or failure about yourself  0 0 0 - 0  Trouble concentrating 0 0 0 - 0  Moving slowly or fidgety/restless 0 0 0 - 0  Suicidal thoughts 0 0 0 - 0  PHQ-9 Score 3 0 0 - 0  Difficult doing work/chores - Not difficult at all Not difficult at all - Not difficult at all  Some recent data might be hidden    Past Medical History:  Past Medical History:  Diagnosis Date   Anxiety    Depression    Diabetes mellitus without complication ([place])    GERD (gastroesophageal reflux disease)    Hyperlipidemia    Hypertension      Surgical History:  Past Surgical History:  Procedure Laterality Date   COLONOSCOPY WITH PROPOFOL N/A 07/11/2020   Procedure: COLONOSCOPY WITH PROPOFOL;  Surgeon: Virgel Manifold, MD;  Location: [facility] ENDOSCOPY;  Service: Endoscopy;  Laterality: N/A;   UPPER GASTROINTESTINAL ENDOSCOPY      Medications:  Current Outpatient Medications on File Prior to Visit  Medication Sig   atenolol (TENORMIN) 100 MG tablet Take 1 tablet (100 mg total) by mouth daily.   atorvastatin (LIPITOR) 40 MG tablet Take 1 tablet (40 mg total) by mouth daily.   benazepril (LOTENSIN) 10 MG tablet Take 1 tablet (10 mg total) by mouth daily.   clonazePAM (KLONOPIN) 1 MG tablet Take 1 tablet (1 mg total) by mouth daily as needed for anxiety (1/2 each AM and 1/2 PRN).   DULoxetine (CYMBALTA) 60 MG capsule Take 1 capsule (60 mg total) by mouth daily.   esomeprazole (NEXIUM) 40 MG capsule Take 1 capsule (40 mg total) by mouth 2 (two) times daily before a meal.   metFORMIN (GLUCOPHAGE-XR) 500 MG 24 hr tablet Take 1 tablet (500 mg total) by mouth in the morning and at bedtime.   No current facility-administered medications on file prior to visit.    Allergies:  No Known Allergies  Social History:  Social History   Socioeconomic History   Marital status: Single    Spouse name: Not on file   Number of children: Not on file   Years of education: Not on file   Highest education level: Not on file  Occupational History   Not on file  Tobacco Use   Smoking status: Former    Packs/day: 0.50    Types: Cigarettes    Quit date: 05/24/2000    Years since quitting: 21.1   Smokeless tobacco: Former    Types: Nurse, children's Use: Never used  Substance and Sexual Activity   Alcohol use: Yes    Alcohol/week: 12.0 - 15.0 standard drinks    Types: 12 - 15 Cans of beer per week   Drug use: No   Sexual activity: Yes  Other Topics Concern   Not on file  Social History Narrative   Not on file   Social  Determinants of Health   Financial Resource Strain: Not on file  Food Insecurity: Not on file  Transportation Needs: Not on file  Physical Activity: Not on file  Stress: Not on file  Social Connections: Not on file  Intimate Partner Violence: Not on file   Social History   Tobacco Use  Smoking Status Former   Packs/day: 0.50   Types: Cigarettes   Quit date: 05/24/2000   Years since quitting: 21.1  Smokeless Tobacco Former   Types: Chew   Social History   Substance and Sexual Activity  Alcohol Use Yes   Alcohol/week: 12.0 - 15.0 standard drinks  Types: 12 - 15 Cans of beer per week    Family History:  Family History  Problem Relation Age of Onset   Hypertension Mother    Hypertension Father    Heart disease Father    Heart attack Father    Cancer Paternal Uncle        throat   Heart attack Paternal Grandfather     Past medical history, surgical history, medications, allergies, family history and social history reviewed with patient today and changes made to appropriate areas of the chart.   Review of Systems  Constitutional: Negative.   HENT: Negative.    Eyes: Negative.   Respiratory: Negative.    Cardiovascular: Negative.   Gastrointestinal: Negative.   Genitourinary:  Positive for frequency. Negative for dysuria, flank pain, hematuria and urgency.  Musculoskeletal: Negative.   Skin: Negative.   Neurological:  Positive for dizziness and tingling. Negative for tremors, sensory change, speech change, focal weakness, seizures, loss of consciousness, weakness and headaches.  Endo/Heme/Allergies: Negative.   Psychiatric/Behavioral:  Negative for depression, hallucinations, memory loss, substance abuse and suicidal ideas. The patient is nervous/anxious. The patient does not have insomnia.   All other ROS negative except what is listed above and in the HPI.      Objective:    BP 132/87    Pulse 70    Temp 98 F (36.7 C)    Ht 5\' 10"  (1.778 m)    Wt 274 lb 3.2 oz  (124.4 kg)    SpO2 97%    BMI 39.34 kg/m   Wt Readings from Last 3 Encounters:  07/20/21 274 lb 3.2 oz (124.4 kg)  03/31/21 272 lb 6.4 oz (123.6 kg)  02/10/21 274 lb 12.8 oz (124.6 kg)    Physical Exam Vitals and nursing note reviewed.  Constitutional:      General: He is not in acute distress.    Appearance: Normal appearance. He is obese. He is not ill-appearing, toxic-appearing or diaphoretic.  HENT:     Head: Normocephalic and atraumatic.     Right Ear: Tympanic membrane, ear canal and external ear normal. There is no impacted cerumen.     Left Ear: Tympanic membrane, ear canal and external ear normal. There is no impacted cerumen.     Nose: Nose normal. No congestion or rhinorrhea.     Mouth/Throat:     Mouth: Mucous membranes are moist.     Pharynx: Oropharynx is clear. No oropharyngeal exudate or posterior oropharyngeal erythema.  Eyes:     General: No scleral icterus.       Right eye: No discharge.        Left eye: No discharge.     Extraocular Movements: Extraocular movements intact.     Conjunctiva/sclera: Conjunctivae normal.     Pupils: Pupils are equal, round, and reactive to light.  Neck:     Vascular: No carotid bruit.  Cardiovascular:     Rate and Rhythm: Normal rate and regular rhythm.     Pulses: Normal pulses.     Heart sounds: No murmur heard.   No friction rub. No gallop.  Pulmonary:     Effort: Pulmonary effort is normal. No respiratory distress.     Breath sounds: Normal breath sounds. No stridor. No wheezing, rhonchi or rales.  Chest:     Chest wall: No tenderness.  Abdominal:     General: Abdomen is flat. Bowel sounds are normal. There is no distension.     Palpations: Abdomen is soft.  There is no mass.     Tenderness: There is no abdominal tenderness. There is no right CVA tenderness, left CVA tenderness, guarding or rebound.     Hernia: No hernia is present.  Genitourinary:    Comments: Genital exam deferred with shared decision  making Musculoskeletal:        General: No swelling, tenderness, deformity or signs of injury.     Cervical back: Normal range of motion and neck supple. No rigidity. No muscular tenderness.     Right lower leg: No edema.     Left lower leg: No edema.  Lymphadenopathy:     Cervical: No cervical adenopathy.  Skin:    General: Skin is warm and dry.     Capillary Refill: Capillary refill takes less than 2 seconds.     Coloration: Skin is not jaundiced or pale.     Findings: No bruising, erythema, lesion or rash.  Neurological:     General: No focal deficit present.     Mental Status: He is alert and oriented to person, place, and time.     Cranial Nerves: No cranial nerve deficit.     Sensory: No sensory deficit.     Motor: No weakness.     Coordination: Coordination normal.     Gait: Gait normal.     Deep Tendon Reflexes: Reflexes normal.  Psychiatric:        Mood and Affect: Mood normal.        Behavior: Behavior normal.        Thought Content: Thought content normal.        Judgment: Judgment normal.    Results for orders placed or performed in visit on 03/31/21  Bayer DCA Hb A1c Waived  Result Value Ref Range   HB A1C (BAYER DCA - WAIVED) 5.9 (H) 4.8 - 5.6 %      Assessment & Plan:   Problem List Items Addressed This Visit       Cardiovascular and Mediastinum   Hypertension    Under good control on current regimen. Continue current regimen. Continue to monitor. Call with any concerns. Refills given. Labs drawn today.       Relevant Orders   Comprehensive metabolic panel   CBC with Differential/Platelet   TSH   Homocysteine     Digestive   GERD (gastroesophageal reflux disease)    Under good control on current regimen. Continue current regimen. Continue to monitor. Call with any concerns. Refills given. Labs drawn today.      Relevant Orders   Comprehensive metabolic panel   CBC with Differential/Platelet   Homocysteine     Endocrine   Controlled  diabetes mellitus type 2 with complications ([place])    Doing well with 6.4 A1c. Continue current regimen. Continue to monitor.       Relevant Orders   Comprehensive metabolic panel   CBC with Differential/Platelet   Urinalysis, Routine w reflex microscopic   Microalbumin, Urine Waived   Bayer DCA Hb A1c Waived   Homocysteine     Nervous and Auditory   Osteoarthritis of spine with radiculopathy, cervical region    Will continue stretches. Call with any concerns. Continue to monitor.        Other   Hyperlipidemia   Relevant Orders   Comprehensive metabolic panel   CBC with Differential/Platelet   Lipid Panel w/o Chol/HDL Ratio   Homocysteine   Depression   Relevant Orders   Comprehensive metabolic panel   CBC with Differential/Platelet  Homocysteine   Other Visit Diagnoses     Routine general medical examination at a health care facility    -  Primary   Vaccines up to date. Screening labs checked today. Colonoscopy up to date. Continue diet and exercise. Continue diet and exercise.    Relevant Orders   PSA       LABORATORY TESTING:  Health maintenance labs ordered today as discussed above.   The natural history of prostate cancer and ongoing controversy regarding screening and potential treatment outcomes of prostate cancer has been discussed with the patient. The meaning of a false positive PSA and a false negative PSA has been discussed. He indicates understanding of the limitations of this screening test and wishes to proceed with screening PSA testing.   IMMUNIZATIONS:   - Tdap: Tetanus vaccination status reviewed: last tetanus booster within 10 years. - Influenza: Refused - Pneumovax: Up to date - Prevnar: Given elsewhere - COVID: Refused - Shingrix vaccine: Refused  SCREENING: - Colonoscopy: Up to date  Discussed with patient purpose of the colonoscopy is to detect colon cancer at curable precancerous or early stages   PATIENT COUNSELING:    Sexuality:  Discussed sexually transmitted diseases, partner selection, use of condoms, avoidance of unintended pregnancy  and contraceptive alternatives.   Advised to avoid cigarette smoking.  I discussed with the patient that most people either abstain from alcohol or drink within safe limits (<=14/week and <=4 drinks/occasion for males, <=7/weeks and <= 3 drinks/occasion for females) and that the risk for alcohol disorders and other health effects rises proportionally with the number of drinks per week and how often a drinker exceeds daily limits.  Discussed cessation/primary prevention of drug use and availability of treatment for abuse.   Diet: Encouraged to adjust caloric intake to maintain  or achieve ideal body weight, to reduce intake of dietary saturated fat and total fat, to limit sodium intake by avoiding high sodium foods and not adding table salt, and to maintain adequate dietary potassium and calcium preferably from fresh fruits, vegetables, and low-fat dairy products.    stressed the importance of regular exercise  Injury prevention: Discussed safety belts, safety helmets, smoke detector, smoking near bedding or upholstery.   Dental health: Discussed importance of regular tooth brushing, flossing, and dental visits.   Follow up plan: NEXT PREVENTATIVE PHYSICAL DUE IN 1 YEAR. Return in about 3 months (around 10/17/2021).

## 2021-07-20 NOTE — Assessment & Plan Note (Signed)
Will continue stretches. Call with any concerns. Continue to monitor.

## 2021-07-20 NOTE — Assessment & Plan Note (Signed)
Doing well with 6.4 A1c. Continue current regimen. Continue to monitor.

## 2021-07-21 LAB — COMPREHENSIVE METABOLIC PANEL
ALT: 32 IU/L (ref 0–44)
AST: 22 IU/L (ref 0–40)
Albumin/Globulin Ratio: 1.3 (ref 1.2–2.2)
Albumin: 4.6 g/dL (ref 3.8–4.9)
Alkaline Phosphatase: 76 IU/L (ref 44–121)
BUN/Creatinine Ratio: 11 (ref 9–20)
BUN: 11 mg/dL (ref 6–24)
Bilirubin Total: 0.5 mg/dL (ref 0.0–1.2)
CO2: 24 mmol/L (ref 20–29)
Calcium: 9.8 mg/dL (ref 8.7–10.2)
Chloride: 99 mmol/L (ref 96–106)
Creatinine, Ser: 0.98 mg/dL (ref 0.76–1.27)
Globulin, Total: 3.5 g/dL (ref 1.5–4.5)
Glucose: 127 mg/dL — ABNORMAL HIGH (ref 70–99)
Potassium: 4 mmol/L (ref 3.5–5.2)
Sodium: 138 mmol/L (ref 134–144)
Total Protein: 8.1 g/dL (ref 6.0–8.5)
eGFR: 93 mL/min/{1.73_m2} (ref >59)

## 2021-07-21 LAB — CBC WITH DIFFERENTIAL/PLATELET
Basophils Absolute: 0 10*3/uL (ref 0.0–0.2)
Basos: 1 %
EOS (ABSOLUTE): 0.4 10*3/uL (ref 0.0–0.4)
Eos: 5 %
Hematocrit: 46.2 % (ref 37.5–51.0)
Hemoglobin: 15.7 g/dL (ref 13.0–17.7)
Immature Grans (Abs): 0 10*3/uL (ref 0.0–0.1)
Immature Granulocytes: 0 %
Lymphocytes Absolute: 1.6 10*3/uL (ref 0.7–3.1)
Lymphs: 21 %
MCH: 27.1 pg (ref 26.6–33.0)
MCHC: 34 g/dL (ref 31.5–35.7)
MCV: 80 fL (ref 79–97)
Monocytes Absolute: 0.6 10*3/uL (ref 0.1–0.9)
Monocytes: 8 %
Neutrophils Absolute: 5 10*3/uL (ref 1.4–7.0)
Neutrophils: 65 %
Platelets: 263 10*3/uL (ref 150–450)
RBC: 5.8 x10E6/uL (ref 4.14–5.80)
RDW: 14.5 % (ref 11.6–15.4)
WBC: 7.6 10*3/uL (ref 3.4–10.8)

## 2021-07-21 LAB — LIPID PANEL W/O CHOL/HDL RATIO
Cholesterol, Total: 129 mg/dL (ref 100–199)
HDL: 43 mg/dL (ref >39)
LDL Chol Calc (NIH): 65 mg/dL (ref 0–99)
Triglycerides: 116 mg/dL (ref 0–149)
VLDL Cholesterol Cal: 21 mg/dL (ref 5–40)

## 2021-07-21 LAB — PSA: Prostate Specific Ag, Serum: 0.6 ng/mL (ref 0.0–4.0)

## 2021-07-21 LAB — TSH: TSH: 1.29 u[IU]/mL (ref 0.450–4.500)

## 2021-07-22 LAB — HOMOCYSTEINE: Homocysteine: 9.9 umol/L (ref 0.0–14.5)

## 2021-08-10 ENCOUNTER — Other Ambulatory Visit: Payer: Self-pay | Admitting: Family Medicine

## 2021-08-10 DIAGNOSIS — F32A Depression, unspecified: Secondary | ICD-10-CM

## 2021-08-12 NOTE — Telephone Encounter (Signed)
Requested medications are due for refill today.  yes ? ?Requested medications are on the active medications list.  yes ? ?Last refill. 12/25/2020 #30 1 refill ? ?Future visit scheduled.   yes ? ?Notes to clinic.  Medication not delegated. ? ? ? ?Requested Prescriptions  ?Pending Prescriptions Disp Refills  ? clonazePAM (KLONOPIN) 1 MG tablet [Pharmacy Med Name: clonazePAM Oral Tablet 1 MG] 30 tablet 0  ?  Sig: take 1/2 tablet by mouth each morning and 1/2 tablet as needed  ?  ? Not Delegated - Psychiatry: Anxiolytics/Hypnotics 2 Failed - 08/10/2021 12:10 PM  ?  ?  Failed - This refill cannot be delegated  ?  ?  Failed - Urine Drug Screen completed in last 360 days  ?  ?  Passed - Patient is not pregnant  ?  ?  Passed - Valid encounter within last 6 months  ?  Recent Outpatient Visits   ? ?      ? 3 weeks ago Routine general medical examination at a health care facility  ? [place], [place] P, DO  ? 4 months ago Controlled type 2 diabetes mellitus with complication, without long-term current use of insulin ([place])  ? [place], [place] P, DO  ? 6 months ago Controlled type 2 diabetes mellitus with complication, without long-term current use of insulin ([place])  ? [place], [place] P, DO  ? 7 months ago Moderate episode of recurrent major depressive disorder ([place])  ? [place], [place] P, DO  ? 1 year ago Controlled type 2 diabetes mellitus with microalbuminuria, without long-term current use of insulin ([place])  ? [place], [place] P, DO  ? ?  ?  ?Future Appointments   ? ?        ? In 2 months Wynetta Emery, Barb Merino, DO [place]  ? In 7 months Ralene Bathe, MD [place]  ? ?  ? ?  ?  ?  ?  ?

## 2021-09-08 ENCOUNTER — Other Ambulatory Visit: Payer: Self-pay | Admitting: Family Medicine

## 2021-09-08 DIAGNOSIS — I10 Essential (primary) hypertension: Secondary | ICD-10-CM

## 2021-09-08 NOTE — Telephone Encounter (Signed)
Requested Prescriptions  ?Pending Prescriptions Disp Refills  ?? benazepril (LOTENSIN) 10 MG tablet [Pharmacy Med Name: BENAZEPRIL HCL '10MG'$  TABS] 90 tablet 1  ?  Sig: TAKE ONE TABLET BY MOUTH DAILY  ?  ? Cardiovascular:  ACE Inhibitors Passed - 09/08/2021 11:18 AM  ?  ?  Passed - Cr in normal range and within 180 days  ?  Creatinine, Ser  ?Date Value Ref Range Status  ?07/20/2021 0.98 0.76 - 1.27 mg/dL Final  ?   ?  ?  Passed - K in normal range and within 180 days  ?  Potassium  ?Date Value Ref Range Status  ?07/20/2021 4.0 3.5 - 5.2 mmol/L Final  ?   ?  ?  Passed - Patient is not pregnant  ?  ?  Passed - Last BP in normal range  ?  BP Readings from Last 1 Encounters:  ?07/20/21 132/87  ?   ?  ?  Passed - Valid encounter within last 6 months  ?  Recent Outpatient Visits   ?      ? 1 month ago Routine general medical examination at a health care facility  ? [place], Megan P, DO  ? 5 months ago Controlled type 2 diabetes mellitus with complication, without long-term current use of insulin ([place])  ? [place], Megan P, DO  ? 7 months ago Controlled type 2 diabetes mellitus with complication, without long-term current use of insulin ([place])  ? [place], [place] P, DO  ? 8 months ago Moderate episode of recurrent major depressive disorder ([place])  ? [place], [place] P, DO  ? 1 year ago Controlled type 2 diabetes mellitus with microalbuminuria, without long-term current use of insulin ([place])  ? [place], [place] P, DO  ?  ?  ?Future Appointments   ?        ? In 1 month Wynetta Emery, Barb Merino, DO [employer]  ? In 6 months Ralene Bathe, MD [place]  ?  ? ?  ?  ?  ?? atenolol (TENORMIN) 100 MG tablet [Pharmacy Med Name: ATENOLOL '100MG'$  TABS] 90 tablet 1  ?  Sig: TAKE ONE TABLET BY MOUTH DAILY  ?  ? Cardiovascular: Beta Blockers 2 Passed - 09/08/2021 11:18 AM  ?  ?  Passed - Cr in normal  range and within 360 days  ?  Creatinine, Ser  ?Date Value Ref Range Status  ?07/20/2021 0.98 0.76 - 1.27 mg/dL Final  ?   ?  ?  Passed - Last BP in normal range  ?  BP Readings from Last 1 Encounters:  ?07/20/21 132/87  ?   ?  ?  Passed - Last Heart Rate in normal range  ?  Pulse Readings from Last 1 Encounters:  ?07/20/21 70  ?   ?  ?  Passed - Valid encounter within last 6 months  ?  Recent Outpatient Visits   ?      ? 1 month ago Routine general medical examination at a health care facility  ? [place], Megan P, DO  ? 5 months ago Controlled type 2 diabetes mellitus with complication, without long-term current use of insulin ([place])  ? [place], Megan P, DO  ? 7 months ago Controlled type 2 diabetes mellitus with complication, without long-term current use of insulin ([place])  ? [place], [place], DO  ?  8 months ago Moderate episode of recurrent major depressive disorder ([place])  ? [place], [place] P, DO  ? 1 year ago Controlled type 2 diabetes mellitus with microalbuminuria, without long-term current use of insulin ([place])  ? [place], [place] P, DO  ?  ?  ?Future Appointments   ?        ? In 1 month Wynetta Emery, Barb Merino, DO [employer], [facility]  ? In 6 months Ralene Bathe, MD [place]  ?  ? ?  ?  ?  ?? atorvastatin (LIPITOR) 40 MG tablet [Pharmacy Med Name: ATORVASTATIN CALCIUM '40MG'$  TABS] 90 tablet 1  ?  Sig: TAKE ONE TABLET BY MOUTH DAILY  ?  ? Cardiovascular:  Antilipid - Statins Failed - 09/08/2021 11:18 AM  ?  ?  Failed - Lipid Panel in normal range within the last 12 months  ?  Cholesterol, Total  ?Date Value Ref Range Status  ?07/20/2021 129 100 - 199 mg/dL Final  ? ?Cholesterol [place], [place]  ?Date Value Ref Range Status  ?09/26/2017 143 <200 mg/dL Final  ?  Comment:  ?                          Desirable                <200 ?                        Borderline High      200-  239 ?                        High                     >239 ?  ? ?LDL Chol Calc (NIH)  ?Date Value Ref Range Status  ?07/20/2021 65 0 - 99 mg/dL Final  ? ?HDL  ?Date Value Ref Range Status  ?07/20/2021 43 >39 mg/dL Final  ? ?Triglycerides  ?Date Value Ref Range Status  ?07/20/2021 116 0 - 149 mg/dL Final  ? ?Triglycerides Piccolo,Waived  ?Date Value Ref Range Status  ?09/26/2017 101 <150 mg/dL Final  ?  Comment:  ?                          Normal                   <150 ?                        Borderline High     150 - 199 ?                        High                200 - 499 ?                        Very High                >499 ?  ? ?  ?  ?  Passed - Patient is not pregnant  ?  ?  Passed - Valid encounter within last 12 months  ?  Recent Outpatient Visits   ?      ?  1 month ago Routine general medical examination at a health care facility  ? [place], Megan P, DO  ? 5 months ago Controlled type 2 diabetes mellitus with complication, without long-term current use of insulin ([place])  ? [place], Megan P, DO  ? 7 months ago Controlled type 2 diabetes mellitus with complication, without long-term current use of insulin ([place])  ? [place], [place] P, DO  ? 8 months ago Moderate episode of recurrent major depressive disorder ([place])  ? [place], [place] P, DO  ? 1 year ago Controlled type 2 diabetes mellitus with microalbuminuria, without long-term current use of insulin ([place])  ? [place], [place] P, DO  ?  ?  ?Future Appointments   ?        ? In 1 month Wynetta Emery, Barb Merino, DO [facility] [facility]  ? In 6 months Ralene Bathe, MD [place]  ?  ? ?  ?  ?  ? ?

## 2021-10-20 ENCOUNTER — Ambulatory Visit (INDEPENDENT_AMBULATORY_CARE_PROVIDER_SITE_OTHER): Payer: 59 | Admitting: Family Medicine

## 2021-10-20 ENCOUNTER — Encounter: Payer: Self-pay | Admitting: Family Medicine

## 2021-10-20 VITALS — BP 129/80 | HR 69 | Temp 98.6°F | Wt 277.0 lb

## 2021-10-20 DIAGNOSIS — E118 Type 2 diabetes mellitus with unspecified complications: Secondary | ICD-10-CM | POA: Diagnosis not present

## 2021-10-20 DIAGNOSIS — F419 Anxiety disorder, unspecified: Secondary | ICD-10-CM

## 2021-10-20 DIAGNOSIS — G43109 Migraine with aura, not intractable, without status migrainosus: Secondary | ICD-10-CM

## 2021-10-20 DIAGNOSIS — F32A Depression, unspecified: Secondary | ICD-10-CM

## 2021-10-20 MED ORDER — CLONAZEPAM 1 MG PO TABS: ORAL_TABLET | ORAL | 0 refills | Status: DC

## 2021-10-20 MED ORDER — METFORMIN HCL ER 500 MG PO TB24: 500.0000 mg | ORAL_TABLET | Freq: Every day | ORAL | 1 refills | Status: DC

## 2021-10-20 NOTE — Progress Notes (Signed)
BP 129/80 (BP Location: Left Arm, Cuff Size: Normal)   Pulse 69   Temp 98.6 F (37 C) (Oral)   Wt 277 lb (125.6 kg)   SpO2 97%   BMI 39.75 kg/m    Subjective:    Patient ID: Aaron Robinson, male    DOB: May 29, 1970, 51 y.o.   MRN: [id number]  HPI: Aaron Robinson is a 51 y.o. male  Chief Complaint  Patient presents with   Depression   Diabetes    Eye exam requested from Dr. Waynetta Sandy office   Eye Problem    Pt states he has been getting "flashers" or squiggle lines when getting a headache. States he would like to be checked out with a Neurology. Also states he has been having neck pains. States this has been going on for about 2 years.    DIABETES Hypoglycemic episodes:no Polydipsia/polyuria: no Visual disturbance: no Chest pain: no Paresthesias: no Glucose Monitoring: no  Accucheck frequency: Not Checking  Fasting glucose:  Post prandial:  Evening:  Before meals: Taking Insulin?: no  Long acting insulin:  Short acting insulin: Blood Pressure Monitoring: not checking Retinal Examination: Up to Date Foot Exam: Up to Date Diabetic Education: Completed Pneumovax: Up to Date Influenza: Up to Date Aspirin: no  DEPRESSION Mood status: controlled Satisfied with current treatment?: yes Symptom severity: mild  Duration of current treatment : chronic Side effects: no Medication compliance: excellent compliance Psychotherapy/counseling: no  Previous psychiatric medications: duloxetine and klonopin Depressed mood: no Anxious mood: yes Anhedonia: no Significant weight loss or gain: no Insomnia: no  Fatigue: yes Feelings of worthlessness or guilt: no Impaired concentration/indecisiveness: no Suicidal ideations: no Hopelessness: no Crying spells: no    10/20/2021    8:33 AM 07/20/2021    8:48 AM 03/31/2021    3:47 PM 01/20/2021    3:25 PM 01/09/2021    8:45 AM  Depression screen PHQ 2/9  Decreased Interest 1 1 0 0 0  Down, Depressed, Hopeless 1 0 0 0 0   PHQ - 2 Score 2 1 0 0 0  Altered sleeping 0 1 0 0   Tired, decreased energy 1 1 0 0   Change in appetite 1 0 0 0   Feeling bad or failure about yourself  0 0 0 0   Trouble concentrating 1 0 0 0   Moving slowly or fidgety/restless 0 0 0 0   Suicidal thoughts 0 0 0 0   PHQ-9 Score 5 3 0 0   Difficult doing work/chores Not difficult at all  Not difficult at all Not difficult at all    MIGRAINES Duration: about 2 years, but getting worse Onset: sudden Severity: moderate Quality: aching and sore Frequency: a few times a month Location: R forehead Headache duration: short lasting  Radiation: no Headache status at time of visit: asymptomatic Treatments attempted: none   Aura: yes Nausea:  no Vomiting: no Photophobia:  yes Phonophobia:  yes Effect on social functioning:  yes Confusion:  no Gait disturbance/ataxia:  no Behavioral changes:  no Fevers:  no   Relevant past medical, surgical, family and social history reviewed and updated as indicated. Interim medical history since our last visit reviewed. Allergies and medications reviewed and updated.  Review of Systems  Constitutional: Negative.   Eyes:  Positive for visual disturbance. Negative for photophobia, pain, discharge, redness and itching.  Respiratory: Negative.    Cardiovascular: Negative.   Gastrointestinal: Negative.   Musculoskeletal: Negative.   Neurological:  Positive  for headaches. Negative for dizziness, tremors, seizures, syncope, facial asymmetry, speech difficulty, weakness, light-headedness and numbness.  Psychiatric/Behavioral: Negative.     Per HPI unless specifically indicated above     Objective:    BP 129/80 (BP Location: Left Arm, Cuff Size: Normal)   Pulse 69   Temp 98.6 F (37 C) (Oral)   Wt 277 lb (125.6 kg)   SpO2 97%   BMI 39.75 kg/m   Wt Readings from Last 3 Encounters:  10/20/21 277 lb (125.6 kg)  07/20/21 274 lb 3.2 oz (124.4 kg)  03/31/21 272 lb 6.4 oz (123.6 kg)     Physical Exam Vitals and nursing note reviewed.  Constitutional:      General: He is not in acute distress.    Appearance: Normal appearance. He is obese. He is not ill-appearing, toxic-appearing or diaphoretic.  HENT:     Head: Normocephalic and atraumatic.     Right Ear: External ear normal.     Left Ear: External ear normal.     Nose: Nose normal.     Mouth/Throat:     Mouth: Mucous membranes are moist.     Pharynx: Oropharynx is clear.  Eyes:     General: No scleral icterus.       Right eye: No discharge.        Left eye: No discharge.     Extraocular Movements: Extraocular movements intact.     Conjunctiva/sclera: Conjunctivae normal.     Pupils: Pupils are equal, round, and reactive to light.  Cardiovascular:     Rate and Rhythm: Normal rate and regular rhythm.     Pulses: Normal pulses.     Heart sounds: Normal heart sounds. No murmur heard.   No friction rub. No gallop.  Pulmonary:     Effort: Pulmonary effort is normal. No respiratory distress.     Breath sounds: Normal breath sounds. No stridor. No wheezing, rhonchi or rales.  Chest:     Chest wall: No tenderness.  Musculoskeletal:        General: Normal range of motion.     Cervical back: Normal range of motion and neck supple.  Skin:    General: Skin is warm and dry.     Capillary Refill: Capillary refill takes less than 2 seconds.     Coloration: Skin is not jaundiced or pale.     Findings: No bruising, erythema, lesion or rash.  Neurological:     General: No focal deficit present.     Mental Status: He is alert and oriented to person, place, and time. Mental status is at baseline.  Psychiatric:        Mood and Affect: Mood normal.        Behavior: Behavior normal.        Thought Content: Thought content normal.        Judgment: Judgment normal.    Results for orders placed or performed in visit on 07/20/21  Microscopic Examination   Urine  Result Value Ref Range   WBC, UA None seen 0 - 5 /hpf   RBC  None seen 0 - 2 /hpf   Epithelial Cells (non renal) None seen 0 - 10 /hpf   Mucus, UA Present (A) Not Estab.   Bacteria, UA None seen None seen/Few  Comprehensive metabolic panel  Result Value Ref Range   Glucose 127 (H) 70 - 99 mg/dL   BUN 11 6 - 24 mg/dL   Creatinine, Ser 0.98 0.76 - 1.27 mg/dL  eGFR 93 >59 mL/min/1.73   BUN/Creatinine Ratio 11 9 - 20   Sodium 138 134 - 144 mmol/L   Potassium 4.0 3.5 - 5.2 mmol/L   Chloride 99 96 - 106 mmol/L   CO2 24 20 - 29 mmol/L   Calcium 9.8 8.7 - 10.2 mg/dL   Total Protein 8.1 6.0 - 8.5 g/dL   Albumin 4.6 3.8 - 4.9 g/dL   Globulin, Total 3.5 1.5 - 4.5 g/dL   Albumin/Globulin Ratio 1.3 1.2 - 2.2   Bilirubin Total 0.5 0.0 - 1.2 mg/dL   Alkaline Phosphatase 76 44 - 121 IU/L   AST 22 0 - 40 IU/L   ALT 32 0 - 44 IU/L  CBC with Differential/Platelet  Result Value Ref Range   WBC 7.6 3.4 - 10.8 x10E3/uL   RBC 5.80 4.14 - 5.80 x10E6/uL   Hemoglobin 15.7 13.0 - 17.7 g/dL   Hematocrit 46.2 37.5 - 51.0 %   MCV 80 79 - 97 fL   MCH 27.1 26.6 - 33.0 pg   MCHC 34.0 31.5 - 35.7 g/dL   RDW 14.5 11.6 - 15.4 %   Platelets 263 150 - 450 x10E3/uL   Neutrophils 65 Not Estab. %   Lymphs 21 Not Estab. %   Monocytes 8 Not Estab. %   Eos 5 Not Estab. %   Basos 1 Not Estab. %   Neutrophils Absolute 5.0 1.4 - 7.0 x10E3/uL   Lymphocytes Absolute 1.6 0.7 - 3.1 x10E3/uL   Monocytes Absolute 0.6 0.1 - 0.9 x10E3/uL   EOS (ABSOLUTE) 0.4 0.0 - 0.4 x10E3/uL   Basophils Absolute 0.0 0.0 - 0.2 x10E3/uL   Immature Granulocytes 0 Not Estab. %   Immature Grans (Abs) 0.0 0.0 - 0.1 x10E3/uL  Lipid Panel w/o Chol/HDL Ratio  Result Value Ref Range   Cholesterol, Total 129 100 - 199 mg/dL   Triglycerides 116 0 - 149 mg/dL   HDL 43 >39 mg/dL   VLDL Cholesterol Cal 21 5 - 40 mg/dL   LDL Chol Calc (NIH) 65 0 - 99 mg/dL  PSA  Result Value Ref Range   Prostate Specific Ag, Serum 0.6 0.0 - 4.0 ng/mL  TSH  Result Value Ref Range   TSH 1.290 0.450 - 4.500 uIU/mL   Urinalysis, Routine w reflex microscopic  Result Value Ref Range   Specific Gravity, UA >1.030 (H) 1.005 - 1.030   pH, UA 5.5 5.0 - 7.5   Color, UA Yellow Yellow   Appearance Ur Clear Clear   Leukocytes,UA Negative Negative   Protein,UA 1+ (A) Negative/Trace   Glucose, UA Negative Negative   Ketones, UA Negative Negative   RBC, UA Negative Negative   Bilirubin, UA Negative Negative   Urobilinogen, Ur 1.0 0.2 - 1.0 mg/dL   Nitrite, UA Negative Negative   Microscopic Examination See below:   Microalbumin, Urine Waived  Result Value Ref Range   Microalb, Ur Waived 80 (H) 0 - 19 mg/L   Creatinine, Urine Waived 300 10 - 300 mg/dL   Microalb/Creat Ratio 30-300 (H) <30 mg/g  Bayer DCA Hb A1c Waived  Result Value Ref Range   HB A1C (BAYER DCA - WAIVED) 6.4 (H) 4.8 - 5.6 %  Homocysteine  Result Value Ref Range   Homocysteine 9.9 0.0 - 14.5 umol/L      Assessment & Plan:   Problem List Items Addressed This Visit       Cardiovascular and Mediastinum   Migraine aura without headache    New onset  in patient >50. Minor headaches, but +Aura. Eye exam has been normal. Will check MRI to r/o mass or or other organic cause. Await results. Treat as needed.        Relevant Medications   clonazePAM (KLONOPIN) 1 MG tablet   Other Relevant Orders   MR Brain Wo Contrast     Endocrine   Controlled diabetes mellitus type 2 with complications ([place]) - Primary    Has been doing good. Rechecking levels today with goal of coming off metformin. Continue to monitor. Call with any concerns.        Relevant Medications   metFORMIN (GLUCOPHAGE-XR) 500 MG 24 hr tablet   Other Relevant Orders   Hgb A1c w/o eAG     Other   Anxiety    Under good control on current regimen. Continue current regimen. Continue to monitor. Call with any concerns. Refills given for 3 months. Follow up 3 months.        Depression    Under good control on current regimen. Continue current regimen. Continue to  monitor. Call with any concerns. Refills given for 3 months. Follow up 3 months.         Relevant Medications   clonazePAM (KLONOPIN) 1 MG tablet     Follow up plan: Return in about 3 months (around 01/20/2022).

## 2021-10-20 NOTE — Assessment & Plan Note (Signed)
New onset in patient >50. Minor headaches, but +Aura. Eye exam has been normal. Will check MRI to r/o mass or or other organic cause. Await results. Treat as needed.

## 2021-10-20 NOTE — Assessment & Plan Note (Signed)
Under good control on current regimen. Continue current regimen. Continue to monitor. Call with any concerns. Refills given for 3 months. Follow up 3 months.    

## 2021-10-20 NOTE — Assessment & Plan Note (Signed)
Has been doing good. Rechecking levels today with goal of coming off metformin. Continue to monitor. Call with any concerns.

## 2021-10-21 LAB — HGB A1C W/O EAG: Hgb A1c MFr Bld: 7.2 % — ABNORMAL HIGH (ref 4.8–5.6)

## 2021-11-02 ENCOUNTER — Ambulatory Visit
Admission: RE | Admit: 2021-11-02 | Discharge: 2021-11-02 | Disposition: A | Discharge status: Home / Self Care | Payer: 59 | Source: Ambulatory Visit | Attending: Family Medicine | Admitting: Family Medicine

## 2021-11-02 DIAGNOSIS — G43109 Migraine with aura, not intractable, without status migrainosus: Secondary | ICD-10-CM | POA: Diagnosis present

## 2021-11-03 ENCOUNTER — Other Ambulatory Visit: Payer: Self-pay | Admitting: Family Medicine

## 2021-11-03 DIAGNOSIS — J349 Unspecified disorder of nose and nasal sinuses: Secondary | ICD-10-CM

## 2021-11-03 DIAGNOSIS — R9389 Abnormal findings on diagnostic imaging of other specified body structures: Secondary | ICD-10-CM

## 2022-01-11 ENCOUNTER — Encounter: Payer: Self-pay | Admitting: Family Medicine

## 2022-01-11 ENCOUNTER — Other Ambulatory Visit: Payer: Self-pay | Admitting: Family Medicine

## 2022-01-11 DIAGNOSIS — K219 Gastro-esophageal reflux disease without esophagitis: Secondary | ICD-10-CM

## 2022-01-11 NOTE — Telephone Encounter (Signed)
Pt stated he will be out of his medication and he is going out of town tomorrow. Pt stated he really needs the Rx for DULoxetine HCl 60 mg to be approved so he can pick it before leaving out.

## 2022-02-01 ENCOUNTER — Ambulatory Visit: Payer: 59 | Admitting: Family Medicine

## 2022-02-09 ENCOUNTER — Encounter: Payer: Self-pay | Admitting: Family Medicine

## 2022-02-09 ENCOUNTER — Ambulatory Visit (INDEPENDENT_AMBULATORY_CARE_PROVIDER_SITE_OTHER): Payer: 59 | Admitting: Family Medicine

## 2022-02-09 VITALS — BP 134/85 | HR 69 | Temp 98.1°F | Wt 277.2 lb

## 2022-02-09 DIAGNOSIS — F419 Anxiety disorder, unspecified: Secondary | ICD-10-CM

## 2022-02-09 DIAGNOSIS — E118 Type 2 diabetes mellitus with unspecified complications: Secondary | ICD-10-CM | POA: Diagnosis not present

## 2022-02-09 DIAGNOSIS — K219 Gastro-esophageal reflux disease without esophagitis: Secondary | ICD-10-CM

## 2022-02-09 DIAGNOSIS — E785 Hyperlipidemia, unspecified: Secondary | ICD-10-CM

## 2022-02-09 DIAGNOSIS — I10 Essential (primary) hypertension: Secondary | ICD-10-CM

## 2022-02-09 DIAGNOSIS — F331 Major depressive disorder, recurrent, moderate: Secondary | ICD-10-CM

## 2022-02-09 LAB — BAYER DCA HB A1C WAIVED: HB A1C (BAYER DCA - WAIVED): 7 % — ABNORMAL HIGH (ref 4.8–5.6)

## 2022-02-09 MED ORDER — ESOMEPRAZOLE MAGNESIUM 40 MG PO CPDR: DELAYED_RELEASE_CAPSULE | ORAL | 3 refills | Status: DC

## 2022-02-09 MED ORDER — SILDENAFIL CITRATE 100 MG PO TABS: 50.0000 mg | ORAL_TABLET | Freq: Every day | ORAL | 11 refills | Status: DC | PRN

## 2022-02-09 MED ORDER — DULOXETINE HCL 60 MG PO CPEP: 60.0000 mg | ORAL_CAPSULE | Freq: Every day | ORAL | 1 refills | Status: DC

## 2022-02-09 MED ORDER — ATENOLOL 100 MG PO TABS: 100.0000 mg | ORAL_TABLET | Freq: Every day | ORAL | 1 refills | Status: DC

## 2022-02-09 MED ORDER — METFORMIN HCL ER 500 MG PO TB24: 500.0000 mg | ORAL_TABLET | Freq: Every day | ORAL | 1 refills | Status: DC

## 2022-02-09 MED ORDER — ATORVASTATIN CALCIUM 40 MG PO TABS: 40.0000 mg | ORAL_TABLET | Freq: Every day | ORAL | 1 refills | Status: DC

## 2022-02-09 MED ORDER — BENAZEPRIL HCL 10 MG PO TABS: 10.0000 mg | ORAL_TABLET | Freq: Every day | ORAL | 1 refills | Status: DC

## 2022-02-09 NOTE — Assessment & Plan Note (Signed)
Under good control on current regimen. Continue current regimen. Continue to monitor. Call with any concerns. Refills given. Labs drawn today.   

## 2022-02-09 NOTE — Assessment & Plan Note (Signed)
Really wants to try coming off medicine. A1c OK at 7.0. Will try off meds for 3 months and recheck.

## 2022-02-09 NOTE — Assessment & Plan Note (Signed)
Under good control on current regimen. Continue current regimen. Continue to monitor. Call with any concerns. Refills given.   

## 2022-02-09 NOTE — Progress Notes (Signed)
BP 134/85   Pulse 69   Temp 98.1 F (36.7 C)   Wt 277 lb 3.2 oz (125.7 kg)   SpO2 98%   BMI 39.77 kg/m    Subjective:    Patient ID: Aaron Robinson, male    DOB: [birth year], 51 y.o.   MRN: [id number]  HPI: Aaron Robinson is a 51 y.o. male  Chief Complaint  Patient presents with   Anxiety    Patient states he has been dealing with some increased anxiety due to family issues    Depression   Diabetes   Erectile Dysfunction    Patient states he is interested in medication for ED    ANXIETY/DEPRESSION Duration: chronic Status:exacerbated Anxious mood: yes  Excessive worrying: yes Irritability: no  Sweating: no Nausea: no Palpitations:no Hyperventilation: no Panic attacks: no Agoraphobia: no  Obscessions/compulsions: no Depressed mood: yes    02/09/2022    8:30 AM 10/20/2021    8:33 AM 07/20/2021    8:48 AM 03/31/2021    3:47 PM 01/20/2021    3:25 PM  Depression screen PHQ 2/9  Decreased Interest 0 1 1 0 0  Down, Depressed, Hopeless 0 1 0 0 0  PHQ - 2 Score 0 2 1 0 0  Altered sleeping 0 0 1 0 0  Tired, decreased energy 0 1 1 0 0  Change in appetite 0 1 0 0 0  Feeling bad or failure about yourself  1 0 0 0 0  Trouble concentrating 0 1 0 0 0  Moving slowly or fidgety/restless 0 0 0 0 0  Suicidal thoughts 0 0 0 0 0  PHQ-9 Score '1 5 3 '$ 0 0  Difficult doing work/chores  Not difficult at all  Not difficult at all Not difficult at all   Anhedonia: no Weight changes: no Insomnia: no   Hypersomnia: no Fatigue/loss of energy: no Feelings of worthlessness: no Feelings of guilt: no Impaired concentration/indecisiveness: no Suicidal ideations: no  Crying spells: no Recent Stressors/Life Changes: no   Relationship problems: no   Family stress: yes     Financial stress: no    Job stress: no    Recent death/loss: no  DIABETES Hypoglycemic episodes:no Polydipsia/polyuria: no Visual disturbance: no Chest pain: no Paresthesias: no Glucose Monitoring:  no  Accucheck frequency: Not Checking Taking Insulin?: no Blood Pressure Monitoring: not checking Retinal Examination:  unsure Foot Exam: Up to Date Diabetic Education: Completed Pneumovax: Up to Date Influenza:  declined Aspirin: no  HYPERTENSION / HYPERLIPIDEMIA Satisfied with current treatment? yes Duration of hypertension: chronic BP medication side effects: no Past BP meds: benazepril, atenolol Duration of hyperlipidemia: chronic Cholesterol medication side effects: no Cholesterol supplements: none Past cholesterol medications: atorvastatin Medication compliance: excellent compliance Aspirin: no Recent stressors: no Recurrent headaches: no Visual changes: no Palpitations: no Dyspnea: no Chest pain: no Lower extremity edema: no Dizzy/lightheaded: no  Relevant past medical, surgical, family and social history reviewed and updated as indicated. Interim medical history since our last visit reviewed. Allergies and medications reviewed and updated.  Review of Systems  Constitutional: Negative.   Respiratory: Negative.    Cardiovascular: Negative.   Gastrointestinal: Negative.   Musculoskeletal: Negative.   Neurological: Negative.   Psychiatric/Behavioral:  Positive for dysphoric mood. Negative for agitation, behavioral problems, confusion, decreased concentration, hallucinations, self-injury, sleep disturbance and suicidal ideas. The patient is nervous/anxious. The patient is not hyperactive.     Per HPI unless specifically indicated above     Objective:  BP 134/85   Pulse 69   Temp 98.1 F (36.7 C)   Wt 277 lb 3.2 oz (125.7 kg)   SpO2 98%   BMI 39.77 kg/m   Wt Readings from Last 3 Encounters:  02/09/22 277 lb 3.2 oz (125.7 kg)  10/20/21 277 lb (125.6 kg)  07/20/21 274 lb 3.2 oz (124.4 kg)    Physical Exam Vitals and nursing note reviewed.  Constitutional:      General: He is not in acute distress.    Appearance: Normal appearance. He is obese. He is  not ill-appearing, toxic-appearing or diaphoretic.  HENT:     Head: Normocephalic and atraumatic.     Right Ear: External ear normal.     Left Ear: External ear normal.     Nose: Nose normal.     Mouth/Throat:     Mouth: Mucous membranes are moist.     Pharynx: Oropharynx is clear.  Eyes:     General: No scleral icterus.       Right eye: No discharge.        Left eye: No discharge.     Extraocular Movements: Extraocular movements intact.     Conjunctiva/sclera: Conjunctivae normal.     Pupils: Pupils are equal, round, and reactive to light.  Cardiovascular:     Rate and Rhythm: Normal rate and regular rhythm.     Pulses: Normal pulses.     Heart sounds: Normal heart sounds. No murmur heard.    No friction rub. No gallop.  Pulmonary:     Effort: Pulmonary effort is normal. No respiratory distress.     Breath sounds: Normal breath sounds. No stridor. No wheezing, rhonchi or rales.  Chest:     Chest wall: No tenderness.  Musculoskeletal:        General: Normal range of motion.     Cervical back: Normal range of motion and neck supple.  Skin:    General: Skin is warm and dry.     Capillary Refill: Capillary refill takes less than 2 seconds.     Coloration: Skin is not jaundiced or pale.     Findings: No bruising, erythema, lesion or rash.  Neurological:     General: No focal deficit present.     Mental Status: He is alert and oriented to person, place, and time. Mental status is at baseline.  Psychiatric:        Mood and Affect: Mood normal.        Behavior: Behavior normal.        Thought Content: Thought content normal.        Judgment: Judgment normal.     Results for orders placed or performed in visit on 02/09/22  Bayer DCA Hb A1c Waived  Result Value Ref Range   HB A1C (BAYER DCA - WAIVED) 7.0 (H) 4.8 - 5.6 %      Assessment & Plan:   Problem List Items Addressed This Visit       Cardiovascular and Mediastinum   Hypertension    Under good control on current  regimen. Continue current regimen. Continue to monitor. Call with any concerns. Refills given. Labs drawn today.       Relevant Medications   sildenafil (VIAGRA) 100 MG tablet   atenolol (TENORMIN) 100 MG tablet   atorvastatin (LIPITOR) 40 MG tablet   benazepril (LOTENSIN) 10 MG tablet     Digestive   GERD (gastroesophageal reflux disease)    Under good control on current regimen. Continue  current regimen. Continue to monitor. Call with any concerns. Refills given.        Relevant Medications   esomeprazole (NEXIUM) 40 MG capsule     Endocrine   Controlled diabetes mellitus type 2 with complications ([place]) - Primary    Really wants to try coming off medicine. A1c OK at 7.0. Will try off meds for 3 months and recheck.       Relevant Medications   atorvastatin (LIPITOR) 40 MG tablet   benazepril (LOTENSIN) 10 MG tablet   metFORMIN (GLUCOPHAGE-XR) 500 MG 24 hr tablet   Other Relevant Orders   Bayer DCA Hb A1c Waived (Completed)   Comprehensive metabolic panel   CBC with Differential/Platelet   Lipid Panel w/o Chol/HDL Ratio     Other   Anxiety    Not doing great. Does not want to add anything on right now. Continue to monitor. Call with any concerns.       Relevant Medications   DULoxetine (CYMBALTA) 60 MG capsule   Hyperlipidemia    Under good control on current regimen. Continue current regimen. Continue to monitor. Call with any concerns. Refills given. Labs drawn today.       Relevant Medications   sildenafil (VIAGRA) 100 MG tablet   atenolol (TENORMIN) 100 MG tablet   atorvastatin (LIPITOR) 40 MG tablet   benazepril (LOTENSIN) 10 MG tablet   Depression    Not doing great. Does not want to add anything on right now. Continue to monitor. Call with any concerns.       Relevant Medications   DULoxetine (CYMBALTA) 60 MG capsule     Follow up plan: Return in about 3 months (around 05/11/2022).

## 2022-02-09 NOTE — Assessment & Plan Note (Signed)
Not doing great. Does not want to add anything on right now. Continue to monitor. Call with any concerns.

## 2022-02-10 LAB — CBC WITH DIFFERENTIAL/PLATELET
Basophils Absolute: 0 10*3/uL (ref 0.0–0.2)
Basos: 1 %
EOS (ABSOLUTE): 0.3 10*3/uL (ref 0.0–0.4)
Eos: 4 %
Hematocrit: 43.2 % (ref 37.5–51.0)
Hemoglobin: 14.4 g/dL (ref 13.0–17.7)
Immature Grans (Abs): 0 10*3/uL (ref 0.0–0.1)
Immature Granulocytes: 0 %
Lymphocytes Absolute: 1.4 10*3/uL (ref 0.7–3.1)
Lymphs: 21 %
MCH: 27.4 pg (ref 26.6–33.0)
MCHC: 33.3 g/dL (ref 31.5–35.7)
MCV: 82 fL (ref 79–97)
Monocytes Absolute: 0.5 10*3/uL (ref 0.1–0.9)
Monocytes: 8 %
Neutrophils Absolute: 4.5 10*3/uL (ref 1.4–7.0)
Neutrophils: 66 %
Platelets: 228 10*3/uL (ref 150–450)
RBC: 5.26 x10E6/uL (ref 4.14–5.80)
RDW: 14.1 % (ref 11.6–15.4)
WBC: 6.7 10*3/uL (ref 3.4–10.8)

## 2022-02-10 LAB — LIPID PANEL W/O CHOL/HDL RATIO
Cholesterol, Total: 117 mg/dL (ref 100–199)
HDL: 40 mg/dL (ref >39)
LDL Chol Calc (NIH): 55 mg/dL (ref 0–99)
Triglycerides: 120 mg/dL (ref 0–149)
VLDL Cholesterol Cal: 22 mg/dL (ref 5–40)

## 2022-02-10 LAB — COMPREHENSIVE METABOLIC PANEL
ALT: 31 IU/L (ref 0–44)
AST: 22 IU/L (ref 0–40)
Albumin/Globulin Ratio: 1.5 (ref 1.2–2.2)
Albumin: 4.5 g/dL (ref 3.8–4.9)
Alkaline Phosphatase: 63 IU/L (ref 44–121)
BUN/Creatinine Ratio: 12 (ref 9–20)
BUN: 11 mg/dL (ref 6–24)
Bilirubin Total: 0.5 mg/dL (ref 0.0–1.2)
CO2: 24 mmol/L (ref 20–29)
Calcium: 9.3 mg/dL (ref 8.7–10.2)
Chloride: 98 mmol/L (ref 96–106)
Creatinine, Ser: 0.93 mg/dL (ref 0.76–1.27)
Globulin, Total: 3 g/dL (ref 1.5–4.5)
Glucose: 130 mg/dL — ABNORMAL HIGH (ref 70–99)
Potassium: 3.8 mmol/L (ref 3.5–5.2)
Sodium: 138 mmol/L (ref 134–144)
Total Protein: 7.5 g/dL (ref 6.0–8.5)
eGFR: 99 mL/min/{1.73_m2} (ref >59)

## 2022-03-12 ENCOUNTER — Encounter: Payer: Self-pay | Admitting: Family Medicine

## 2022-03-16 MED ORDER — TRULICITY 0.75 MG/0.5ML ~~LOC~~ SOAJ: 0.7500 mg | SUBCUTANEOUS | 1 refills | Status: DC

## 2022-03-18 ENCOUNTER — Ambulatory Visit: Payer: 59 | Admitting: Dermatology

## 2022-03-29 ENCOUNTER — Ambulatory Visit: Payer: Self-pay | Admitting: *Deleted

## 2022-03-29 NOTE — Telephone Encounter (Signed)
"  Pt is calling to ask medication advice if he can stop taking metFORMIN (GLUCOPHAGE-XR) 500 MG 24 hr tablet [[id number]] when he starts using Truclicity."  Pt states has left several messages. Started Trulicity last week, as continued to take metformin. Questioning if he should still be taking med along with Trulicity. Pt does not check BS. Please advise.    Reason for Disposition  [1] Caller has URGENT medicine question about med that PCP or specialist prescribed AND [2] triager unable to answer question  Answer Assessment - Initial Assessment Questions 1. NAME of MEDICINE: "What medicine(s) are you calling about?"     Metformin  2. QUESTION: "What is your question?" (e.g., double dose of medicine, side effect)     Keep taking? 3. PRESCRIBER: "Who prescribed the medicine?" Reason: if prescribed by specialist, call should be referred to that group.     PCP  Protocols used: Medication Question Call-A-AH

## 2022-03-30 NOTE — Telephone Encounter (Signed)
Provider has replied to patient via [facility]. See MyChart message.

## 2022-03-31 ENCOUNTER — Telehealth: Payer: Self-pay | Admitting: Family Medicine

## 2022-03-31 NOTE — Telephone Encounter (Signed)
Copied from [facility] [id number]. Topic: General - Inquiry >> Mar 31, 2022 10:12 AM Marcellus Scott wrote: Reason for CRM: Danae Chen from [facility] stated pt purchased a new CPAP from their store and needs a prescription with settings faxed or emailed. Fax- [phone]  [email or web address] .Amie Critchley - [phone]

## 2022-04-01 NOTE — Telephone Encounter (Signed)
Placed rx in folder for signature.

## 2022-05-11 ENCOUNTER — Encounter: Payer: Self-pay | Admitting: Family Medicine

## 2022-05-11 ENCOUNTER — Telehealth: Payer: Self-pay

## 2022-05-11 ENCOUNTER — Ambulatory Visit (INDEPENDENT_AMBULATORY_CARE_PROVIDER_SITE_OTHER): Payer: 59 | Admitting: Family Medicine

## 2022-05-11 VITALS — BP 141/87 | HR 73 | Temp 98.4°F | Wt 280.1 lb

## 2022-05-11 DIAGNOSIS — E118 Type 2 diabetes mellitus with unspecified complications: Secondary | ICD-10-CM | POA: Diagnosis not present

## 2022-05-11 DIAGNOSIS — F331 Major depressive disorder, recurrent, moderate: Secondary | ICD-10-CM | POA: Diagnosis not present

## 2022-05-11 LAB — BAYER DCA HB A1C WAIVED: HB A1C (BAYER DCA - WAIVED): 6.8 % — ABNORMAL HIGH (ref 4.8–5.6)

## 2022-05-11 MED ORDER — OZEMPIC (0.25 OR 0.5 MG/DOSE) 2 MG/3ML ~~LOC~~ SOPN: 0.2500 mg | PEN_INJECTOR | SUBCUTANEOUS | 2 refills | Status: DC

## 2022-05-11 MED ORDER — DULOXETINE HCL 20 MG PO CPEP: 40.0000 mg | ORAL_CAPSULE | Freq: Every day | ORAL | 1 refills | Status: DC

## 2022-05-11 NOTE — Telephone Encounter (Signed)
Most recent Diabetic Eye Exam requested.

## 2022-05-11 NOTE — Telephone Encounter (Signed)
-----   Message from Valerie Roys, DO sent at 05/11/2022  8:32 AM EST ----- Aaron Robinson- eye exam

## 2022-05-11 NOTE — Assessment & Plan Note (Signed)
Stable with A1c of 6.8. Would like to stop metformin. Will try to get ozempic covered instead of trulicity, if not covered, will send in 1.'5mg'$  of trulicity. Recheck 3 months.

## 2022-05-11 NOTE — Progress Notes (Signed)
BP (!) 141/87 (BP Location: Left Arm, Cuff Size: Large)   Pulse 73   Temp 98.4 F (36.9 C) (Oral)   Wt 280 lb 1.6 oz (127.1 kg)   SpO2 98%   BMI 40.19 kg/m    Subjective:    Patient ID: Aaron Robinson, male    DOB: 1970-10-27, 51 y.o.   MRN: [id number]  HPI: Aaron Robinson is a 51 y.o. male  Chief Complaint  Patient presents with   Diabetes   Depression   Hyperlipidemia   Hypertension   ANXIETY/DEPRESSION- has not needed his klonopin in a  long time Duration: chronic Status:better Anxious mood: no  Excessive worrying: no Irritability: no  Sweating: no Nausea: no Palpitations:no Hyperventilation: no Panic attacks: no Agoraphobia: no  Obscessions/compulsions: no Depressed mood: no    05/11/2022    8:18 AM 02/09/2022    8:30 AM 10/20/2021    8:33 AM 07/20/2021    8:48 AM 03/31/2021    3:47 PM  Depression screen PHQ 2/9  Decreased Interest 0 0 1 1 0  Down, Depressed, Hopeless 0 0 1 0 0  PHQ - 2 Score 0 0 2 1 0  Altered sleeping 0 0 0 1 0  Tired, decreased energy 0 0 1 1 0  Change in appetite 0 0 1 0 0  Feeling bad or failure about yourself  0 1 0 0 0  Trouble concentrating 0 0 1 0 0  Moving slowly or fidgety/restless 0 0 0 0 0  Suicidal thoughts 0 0 0 0 0  PHQ-9 Score 0 '1 5 3 '$ 0  Difficult doing work/chores Not difficult at all  Not difficult at all  Not difficult at all      05/11/2022    8:18 AM 02/09/2022    8:30 AM 10/20/2021    8:33 AM 07/20/2021    8:48 AM  GAD 7 : Generalized Anxiety Score  Nervous, Anxious, on Edge '1 1 1 1  '$ Control/stop worrying '1 2 2 2  '$ Worry too much - different things '1 2 2 1  '$ Trouble relaxing 0 0 1 1  Restless 0 0 0 0  Easily annoyed or irritable 0 1 1 0  Afraid - awful might happen 0 '1 2 1  '$ Total GAD 7 Score '3 7 9 6  '$ Anxiety Difficulty Not difficult at all Somewhat difficult Not difficult at all Not difficult at all   Anhedonia: no Weight changes: no Insomnia: no   Hypersomnia: no Fatigue/loss of energy:  no Feelings of worthlessness: no Feelings of guilt: no Impaired concentration/indecisiveness: no Suicidal ideations: no  Crying spells: no Recent Stressors/Life Changes: no   Relationship problems: no   Family stress: no     Financial stress: no    Job stress: no    Recent death/loss: no  DIABETES Hypoglycemic episodes:no Polydipsia/polyuria: no Visual disturbance: no Chest pain: no Paresthesias: no Glucose Monitoring: no Taking Insulin?: no Blood Pressure Monitoring: not checking Retinal Examination: Up to Date Foot Exam: Up to Date Diabetic Education: Completed Pneumovax: Not Up to Date Influenza:  refused Aspirin: no    Relevant past medical, surgical, family and social history reviewed and updated as indicated. Interim medical history since our last visit reviewed. Allergies and medications reviewed and updated.  Review of Systems  Constitutional: Negative.   Respiratory: Negative.    Cardiovascular: Negative.   Gastrointestinal: Negative.   Musculoskeletal: Negative.   Neurological: Negative.   Psychiatric/Behavioral: Negative.      Per  HPI unless specifically indicated above     Objective:    BP (!) 141/87 (BP Location: Left Arm, Cuff Size: Large)   Pulse 73   Temp 98.4 F (36.9 C) (Oral)   Wt 280 lb 1.6 oz (127.1 kg)   SpO2 98%   BMI 40.19 kg/m   Wt Readings from Last 3 Encounters:  05/11/22 280 lb 1.6 oz (127.1 kg)  02/09/22 277 lb 3.2 oz (125.7 kg)  10/20/21 277 lb (125.6 kg)    Physical Exam Vitals and nursing note reviewed.  Constitutional:      General: He is not in acute distress.    Appearance: Normal appearance. He is obese. He is not ill-appearing, toxic-appearing or diaphoretic.  HENT:     Head: Normocephalic and atraumatic.     Right Ear: External ear normal.     Left Ear: External ear normal.     Nose: Nose normal.     Mouth/Throat:     Mouth: Mucous membranes are moist.     Pharynx: Oropharynx is clear.  Eyes:      General: No scleral icterus.       Right eye: No discharge.        Left eye: No discharge.     Extraocular Movements: Extraocular movements intact.     Conjunctiva/sclera: Conjunctivae normal.     Pupils: Pupils are equal, round, and reactive to light.  Cardiovascular:     Rate and Rhythm: Normal rate and regular rhythm.     Pulses: Normal pulses.     Heart sounds: Normal heart sounds. No murmur heard.    No friction rub. No gallop.  Pulmonary:     Effort: Pulmonary effort is normal. No respiratory distress.     Breath sounds: Normal breath sounds. No stridor. No wheezing, rhonchi or rales.  Chest:     Chest wall: No tenderness.  Musculoskeletal:        General: Normal range of motion.     Cervical back: Normal range of motion and neck supple.  Skin:    General: Skin is warm and dry.     Capillary Refill: Capillary refill takes less than 2 seconds.     Coloration: Skin is not jaundiced or pale.     Findings: No bruising, erythema, lesion or rash.  Neurological:     General: No focal deficit present.     Mental Status: He is alert and oriented to person, place, and time. Mental status is at baseline.  Psychiatric:        Mood and Affect: Mood normal.        Behavior: Behavior normal.        Thought Content: Thought content normal.        Judgment: Judgment normal.     Results for orders placed or performed in visit on 05/11/22  Bayer DCA Hb A1c Waived  Result Value Ref Range   HB A1C (BAYER DCA - WAIVED) 6.8 (H) 4.8 - 5.6 %      Assessment & Plan:   Problem List Items Addressed This Visit       Endocrine   Controlled diabetes mellitus type 2 with complications ([place]) - Primary    Stable with A1c of 6.8. Would like to stop metformin. Will try to get ozempic covered instead of trulicity, if not covered, will send in 1.'5mg'$  of trulicity. Recheck 3 months.       Relevant Medications   Semaglutide,0.25 or 0.'5MG'$ /DOS, (OZEMPIC, 0.25 OR 0.5 MG/DOSE,) 2  MG/3ML SOPN   Other  Relevant Orders   Bayer DCA Hb A1c Waived (Completed)     Other   Depression    Patient wants to decrease medicine. Feeling good on his mood- will cut his cymbalta to '40mg'$  and recheck 3 months. Does not need refill of his klonopin at this time- using very rarely.      Relevant Medications   DULoxetine (CYMBALTA) 20 MG capsule     Follow up plan: Return in about 3 months (around 08/10/2022).

## 2022-05-11 NOTE — Assessment & Plan Note (Signed)
Patient wants to decrease medicine. Feeling good on his mood- will cut his cymbalta to '40mg'$  and recheck 3 months. Does not need refill of his klonopin at this time- using very rarely.

## 2022-05-14 ENCOUNTER — Encounter: Payer: Self-pay | Admitting: Family Medicine

## 2022-06-03 ENCOUNTER — Ambulatory Visit: Payer: 59 | Admitting: Dermatology

## 2022-07-19 ENCOUNTER — Other Ambulatory Visit: Payer: Self-pay | Admitting: Family Medicine

## 2022-07-20 NOTE — Telephone Encounter (Signed)
Requested by interface surescripts. Medication discontinued 05/11/22.  Requested Prescriptions  Refused Prescriptions Disp Refills   metFORMIN (GLUCOPHAGE-XR) 500 MG 24 hr tablet [Pharmacy Med Name: METFORMIN HCL ER '500MG'$  TB24] 180 tablet 1    Sig: TAKE 1 TABLET BY MOUTH IN THE MORNING AND AT BEDTIME     Endocrinology:  Diabetes - Biguanides Failed - 07/19/2022  4:51 PM      Failed - B12 Level in normal range and within 720 days    No results found for: "VITAMINB12"       Passed - Cr in normal range and within 360 days    Creatinine, Ser  Date Value Ref Range Status  02/09/2022 0.93 0.76 - 1.27 mg/dL Final         Passed - HBA1C is between 0 and 7.9 and within 180 days    HB A1C (BAYER DCA - WAIVED)  Date Value Ref Range Status  05/11/2022 6.8 (H) 4.8 - 5.6 % Final    Comment:             Prediabetes: 5.7 - 6.4          Diabetes: >6.4          Glycemic control for adults with diabetes: <7.0          Passed - eGFR in normal range and within 360 days    GFR calc Af Amer  Date Value Ref Range Status  06/25/2020 117 >59 mL/min/1.73 Final    Comment:    **In accordance with recommendations from the NKF-ASN Task force,**   [facility] is in the process of updating its eGFR calculation to the   2021 CKD-EPI creatinine equation that estimates kidney function   without a race variable.    GFR, Estimated  Date Value Ref Range Status  03/03/2021 >60 >60 mL/min Final    Comment:    (NOTE) Calculated using the CKD-EPI Creatinine Equation (2021)    eGFR  Date Value Ref Range Status  02/09/2022 99 >59 mL/min/1.73 Final         Passed - Valid encounter within last 6 months    Recent Outpatient Visits           2 months ago Controlled type 2 diabetes mellitus with complication, without long-term current use of insulin ([place])   [place] P, DO   5 months ago Controlled type 2 diabetes mellitus with complication, without long-term current use  of insulin ([place])   [place], Megan P, DO   9 months ago Controlled type 2 diabetes mellitus with complication, without long-term current use of insulin ([place])   [place], Megan P, DO   1 year ago Routine general medical examination at a health care facility   [place], [place] P, DO   1 year ago Controlled type 2 diabetes mellitus with complication, without long-term current use of insulin [facility])   [place] P, DO       Future Appointments             In 2 weeks Ralene Bathe, MD [place]   In 3 weeks Wynetta Emery, Barb Merino, DO [place]            Passed - CBC within normal limits and completed in the last 12 months    WBC  Date  Value Ref Range Status  02/09/2022 6.7 3.4 - 10.8 x10E3/uL Final   RBC  Date Value Ref Range Status  02/09/2022 5.26 4.14 - 5.80 x10E6/uL Final   Hemoglobin  Date Value Ref Range Status  02/09/2022 14.4 13.0 - 17.7 g/dL Final   Hematocrit  Date Value Ref Range Status  02/09/2022 43.2 37.5 - 51.0 % Final   MCHC  Date Value Ref Range Status  02/09/2022 33.3 31.5 - 35.7 g/dL Final   [facility]  Date Value Ref Range Status  02/09/2022 27.4 26.6 - 33.0 pg Final   MCV  Date Value Ref Range Status  02/09/2022 82 79 - 97 fL Final   No results found for: "PLTCOUNTKUC", "LABPLAT", "POCPLA" RDW  Date Value Ref Range Status  02/09/2022 14.1 11.6 - 15.4 % Final

## 2022-07-22 ENCOUNTER — Encounter: Payer: 59 | Admitting: Family Medicine

## 2022-08-04 ENCOUNTER — Encounter: Payer: Self-pay | Admitting: Dermatology

## 2022-08-04 ENCOUNTER — Ambulatory Visit (INDEPENDENT_AMBULATORY_CARE_PROVIDER_SITE_OTHER): Payer: 59 | Admitting: Dermatology

## 2022-08-04 VITALS — BP 151/89 | HR 96

## 2022-08-04 DIAGNOSIS — D229 Melanocytic nevi, unspecified: Secondary | ICD-10-CM

## 2022-08-04 DIAGNOSIS — L578 Other skin changes due to chronic exposure to nonionizing radiation: Secondary | ICD-10-CM | POA: Diagnosis not present

## 2022-08-04 DIAGNOSIS — Z1283 Encounter for screening for malignant neoplasm of skin: Secondary | ICD-10-CM

## 2022-08-04 DIAGNOSIS — L814 Other melanin hyperpigmentation: Secondary | ICD-10-CM | POA: Diagnosis not present

## 2022-08-04 DIAGNOSIS — L719 Rosacea, unspecified: Secondary | ICD-10-CM | POA: Diagnosis not present

## 2022-08-04 DIAGNOSIS — D1801 Hemangioma of skin and subcutaneous tissue: Secondary | ICD-10-CM

## 2022-08-04 DIAGNOSIS — L821 Other seborrheic keratosis: Secondary | ICD-10-CM

## 2022-08-04 DIAGNOSIS — L918 Other hypertrophic disorders of the skin: Secondary | ICD-10-CM

## 2022-08-04 NOTE — Progress Notes (Signed)
   Follow-Up Visit   Subjective  Aaron Robinson is a 52 y.o. male who presents for the following: Annual Exam (1 year tbse, hx of rosacea, hx of skin tag). The patient presents for Total-Body Skin Exam (TBSE) for skin cancer screening and mole check.  The patient has spots, moles and lesions to be evaluated, some may be new or changing and the patient has concerns that these could be cancer.  The following portions of the chart were reviewed this encounter and updated as appropriate:  Tobacco  Allergies  Meds  Problems  Med Hx  Surg Hx  Fam Hx     Review of Systems: No other skin or systemic complaints except as noted in HPI or Assessment and Plan.  Objective  Well appearing patient in no apparent distress; mood and affect are within normal limits.  A full examination was performed including scalp, head, eyes, ears, nose, lips, neck, chest, axillae, abdomen, back, buttocks, bilateral upper extremities, bilateral lower extremities, hands, feet, fingers, toes, fingernails, and toenails. All findings within normal limits unless otherwise noted below.   Assessment & Plan  Rosacea Head - Anterior (Face) Rosacea is a chronic progressive skin condition usually affecting the face of adults, causing redness and/or acne bumps. It is treatable but not curable. It sometimes affects the eyes (ocular rosacea) as well. It may respond to topical and/or systemic medication and can flare with stress, sun exposure, alcohol, exercise, topical steroids (including hydrocortisone/cortisone 10) and some foods.  Daily application of broad spectrum spf 30+ sunscreen to face is recommended to reduce flares.  Lentigines - Scattered tan macules - Due to sun exposure - Benign-appearing, observe - Recommend daily broad spectrum sunscreen SPF 30+ to sun-exposed areas, reapply every 2 hours as needed. - Call for any changes  Seborrheic Keratoses - Stuck-on, waxy, tan-brown papules and/or plaques  -  Benign-appearing - Discussed benign etiology and prognosis. - Observe - Call for any changes  Melanocytic Nevi - Tan-brown and/or pink-flesh-colored symmetric macules and papules - Benign appearing on exam today - Observation - Call clinic for new or changing moles - Recommend daily use of broad spectrum spf 30+ sunscreen to sun-exposed areas.   Acrochordons (Skin Tags) - Fleshy, skin-colored pedunculated papules - Benign appearing.  - Observe. - If desired, they can be removed with an in office procedure that is not covered by insurance. - Please call the clinic if you notice any new or changing lesions.  Hemangiomas - Red papules - Discussed benign nature - Observe - Call for any changes  Actinic Damage - Chronic condition, secondary to cumulative UV/sun exposure - diffuse scaly erythematous macules with underlying dyspigmentation - Recommend daily broad spectrum sunscreen SPF 30+ to sun-exposed areas, reapply every 2 hours as needed.  - Staying in the shade or wearing long sleeves, sun glasses (UVA+UVB protection) and wide brim hats (4-inch brim around the entire circumference of the hat) are also recommended for sun protection.  - Call for new or changing lesions.  Skin cancer screening performed today. Return for 1 - 3 year tbse .  IRuthell Rummage, CMA, am acting as scribe for Sarina Ser, MD. Documentation: I have reviewed the above documentation for accuracy and completeness, and I agree with the above.  Sarina Ser, MD

## 2022-08-04 NOTE — Patient Instructions (Addendum)
     Melanoma ABCDEs  Melanoma is the most dangerous type of skin cancer, and is the leading cause of death from skin disease.  You are more likely to develop melanoma if you: Have light-colored skin, light-colored eyes, or red or blond hair Spend a lot of time in the sun Tan regularly, either outdoors or in a tanning bed Have had blistering sunburns, especially during childhood Have a close family member who has had a melanoma Have atypical moles or large birthmarks  Early detection of melanoma is key since treatment is typically straightforward and cure rates are extremely high if we catch it early.   The first sign of melanoma is often a change in a mole or a new dark spot.  The ABCDE system is a way of remembering the signs of melanoma.  A for asymmetry:  The two halves do not match. B for border:  The edges of the growth are irregular. C for color:  A mixture of colors are present instead of an even brown color. D for diameter:  Melanomas are usually (but not always) greater than 6mm - the size of a pencil eraser. E for evolution:  The spot keeps changing in size, shape, and color.  Please check your skin once per month between visits. You can use a small mirror in front and a large mirror behind you to keep an eye on the back side or your body.   If you see any new or changing lesions before your next follow-up, please call to schedule a visit.  Please continue daily skin protection including broad spectrum sunscreen SPF 30+ to sun-exposed areas, reapplying every 2 hours as needed when you're outdoors.   Staying in the shade or wearing long sleeves, sun glasses (UVA+UVB protection) and wide brim hats (4-inch brim around the entire circumference of the hat) are also recommended for sun protection.    Due to recent changes in healthcare laws, you may see results of your pathology and/or laboratory studies on MyChart before the doctors have had a chance to review them. We  understand that in some cases there may be results that are confusing or concerning to you. Please understand that not all results are received at the same time and often the doctors may need to interpret multiple results in order to provide you with the best plan of care or course of treatment. Therefore, we ask that you please give us 2 business days to thoroughly review all your results before contacting the office for clarification. Should we see a critical lab result, you will be contacted sooner.   If You Need Anything After Your Visit  If you have any questions or concerns for your doctor, please call our main line at [phone] and press option 4 to reach your doctor's medical assistant. If no one answers, please leave a voicemail as directed and we will return your call as soon as possible. Messages left after 4 pm will be answered the following business day.   You may also send us a message via MyChart. We typically respond to MyChart messages within 1-2 business days.  For prescription refills, please ask your pharmacy to contact our office. Our fax number is [phone].  If you have an urgent issue when the clinic is closed that cannot wait until the next business day, you can page your doctor at the number below.    Please note that while we do our best to be available for urgent issues   outside of office hours, we are not available 24/7.   If you have an urgent issue and are unable to reach us, you may choose to seek medical care at your doctor's office, retail clinic, urgent care center, or emergency room.  If you have a medical emergency, please immediately call 911 or go to the emergency department.  Pager Numbers  - Dr. Kowalski: [phone]  - Dr. Moye: [phone]  - Dr. Stewart: [phone]  In the event of inclement weather, please call our main line at [phone] for an update on the status of any delays or closures.  Dermatology Medication Tips: Please  keep the boxes that topical medications come in in order to help keep track of the instructions about where and how to use these. Pharmacies typically print the medication instructions only on the boxes and not directly on the medication tubes.   If your medication is too expensive, please contact our office at [phone] option 4 or send us a message through MyChart.   We are unable to tell what your co-pay for medications will be in advance as this is different depending on your insurance coverage. However, we may be able to find a substitute medication at lower cost or fill out paperwork to get insurance to cover a needed medication.   If a prior authorization is required to get your medication covered by your insurance company, please allow us 1-2 business days to complete this process.  Drug prices often vary depending on where the prescription is filled and some pharmacies may offer cheaper prices.  The website www.goodrx.com contains coupons for medications through different pharmacies. The prices here do not account for what the cost may be with help from insurance (it may be cheaper with your insurance), but the website can give you the price if you did not use any insurance.  - You can print the associated coupon and take it with your prescription to the pharmacy.  - You may also stop by our office during regular business hours and pick up a GoodRx coupon card.  - If you need your prescription sent electronically to a different pharmacy, notify our office through [facility] MyChart or by phone at [phone] option 4.     Si Usted Necesita Algo Despus de Su Visita  Tambin puede enviarnos un mensaje a travs de MyChart. Por lo general respondemos a los mensajes de MyChart en el transcurso de 1 a 2 das hbiles.  Para renovar recetas, por favor pida a su farmacia que se ponga en contacto con nuestra oficina. Nuestro nmero de fax es el [phone].  Si tiene un asunto urgente  cuando la clnica est cerrada y que no puede esperar hasta el siguiente da hbil, puede llamar/localizar a su doctor(a) al nmero que aparece a continuacin.   Por favor, tenga en cuenta que aunque hacemos todo lo posible para estar disponibles para asuntos urgentes fuera del horario de oficina, no estamos disponibles las 24 horas del da, los 7 das de la semana.   Si tiene un problema urgente y no puede comunicarse con nosotros, puede optar por buscar atencin mdica  en el consultorio de su doctor(a), en una clnica privada, en un centro de atencin urgente o en una sala de emergencias.  Si tiene una emergencia mdica, por favor llame inmediatamente al 911 o vaya a la sala de emergencias.  Nmeros de bper  - Dr. Kowalski: [phone]  - Dra. Moye: [phone]  - Dra. Stewart: [phone]  En caso   de inclemencias del tiempo, por favor llame a nuestra lnea principal al [phone] para una actualizacin sobre el estado de cualquier retraso o cierre.  Consejos para la medicacin en dermatologa: Por favor, guarde las cajas en las que vienen los medicamentos de uso tpico para ayudarle a seguir las instrucciones sobre dnde y cmo usarlos. Las farmacias generalmente imprimen las instrucciones del medicamento slo en las cajas y no directamente en los tubos del medicamento.   Si su medicamento es muy caro, por favor, pngase en contacto con nuestra oficina llamando al [phone] y presione la opcin 4 o envenos un mensaje a travs de MyChart.   No podemos decirle cul ser su copago por los medicamentos por adelantado ya que esto es diferente dependiendo de la cobertura de su seguro. Sin embargo, es posible que podamos encontrar un medicamento sustituto a menor costo o llenar un formulario para que el seguro cubra el medicamento que se considera necesario.   Si se requiere una autorizacin previa para que su compaa de seguros cubra su medicamento, por favor permtanos de 1 a 2  das hbiles para completar este proceso.  Los precios de los medicamentos varan con frecuencia dependiendo del lugar de dnde se surte la receta y alguna farmacias pueden ofrecer precios ms baratos.  El sitio web www.goodrx.com tiene cupones para medicamentos de diferentes farmacias. Los precios aqu no tienen en cuenta lo que podra costar con la ayuda del seguro (puede ser ms barato con su seguro), pero el sitio web puede darle el precio si no utiliz ningn seguro.  - Puede imprimir el cupn correspondiente y llevarlo con su receta a la farmacia.  - Tambin puede pasar por nuestra oficina durante el horario de atencin regular y recoger una tarjeta de cupones de GoodRx.  - Si necesita que su receta se enve electrnicamente a una farmacia diferente, informe a nuestra oficina a travs de MyChart de [place] o por telfono llamando al [phone] y presione la opcin 4.  

## 2022-08-06 ENCOUNTER — Encounter: Payer: Self-pay | Admitting: Dermatology

## 2022-08-16 ENCOUNTER — Ambulatory Visit (INDEPENDENT_AMBULATORY_CARE_PROVIDER_SITE_OTHER): Payer: 59 | Admitting: Family Medicine

## 2022-08-16 ENCOUNTER — Other Ambulatory Visit: Payer: Self-pay | Admitting: Family Medicine

## 2022-08-16 ENCOUNTER — Other Ambulatory Visit: Payer: 59

## 2022-08-16 ENCOUNTER — Encounter: Payer: Self-pay | Admitting: Family Medicine

## 2022-08-16 VITALS — BP 146/87 | HR 74 | Temp 98.7°F | Ht 70.9 in | Wt 270.9 lb

## 2022-08-16 DIAGNOSIS — E785 Hyperlipidemia, unspecified: Secondary | ICD-10-CM

## 2022-08-16 DIAGNOSIS — I10 Essential (primary) hypertension: Secondary | ICD-10-CM

## 2022-08-16 DIAGNOSIS — Z0000 Encounter for general adult medical examination without abnormal findings: Secondary | ICD-10-CM

## 2022-08-16 DIAGNOSIS — Z7984 Long term (current) use of oral hypoglycemic drugs: Secondary | ICD-10-CM

## 2022-08-16 DIAGNOSIS — Z6837 Body mass index (BMI) 37.0-37.9, adult: Secondary | ICD-10-CM

## 2022-08-16 DIAGNOSIS — K219 Gastro-esophageal reflux disease without esophagitis: Secondary | ICD-10-CM

## 2022-08-16 DIAGNOSIS — F331 Major depressive disorder, recurrent, moderate: Secondary | ICD-10-CM

## 2022-08-16 DIAGNOSIS — E118 Type 2 diabetes mellitus with unspecified complications: Secondary | ICD-10-CM | POA: Diagnosis not present

## 2022-08-16 DIAGNOSIS — F419 Anxiety disorder, unspecified: Secondary | ICD-10-CM

## 2022-08-16 DIAGNOSIS — Z125 Encounter for screening for malignant neoplasm of prostate: Secondary | ICD-10-CM

## 2022-08-16 DIAGNOSIS — F32A Depression, unspecified: Secondary | ICD-10-CM

## 2022-08-16 DIAGNOSIS — M5431 Sciatica, right side: Secondary | ICD-10-CM

## 2022-08-16 LAB — MICROSCOPIC EXAMINATION
Bacteria, UA: NONE SEEN
Epithelial Cells (non renal): NONE SEEN /hpf (ref 0–10)
RBC, Urine: NONE SEEN /hpf (ref 0–2)

## 2022-08-16 LAB — MICROALBUMIN, URINE WAIVED
Creatinine, Urine Waived: 200 mg/dL (ref 10–300)
Microalb, Ur Waived: 80 mg/L — ABNORMAL HIGH (ref 0–19)

## 2022-08-16 LAB — URINALYSIS, ROUTINE W REFLEX MICROSCOPIC
Bilirubin, UA: NEGATIVE
Glucose, UA: NEGATIVE
Ketones, UA: NEGATIVE
Leukocytes,UA: NEGATIVE
Nitrite, UA: NEGATIVE
RBC, UA: NEGATIVE
Specific Gravity, UA: 1.025 (ref 1.005–1.030)
Urobilinogen, Ur: 1 mg/dL (ref 0.2–1.0)
pH, UA: 5.5 (ref 5.0–7.5)

## 2022-08-16 LAB — BAYER DCA HB A1C WAIVED: HB A1C (BAYER DCA - WAIVED): 6.3 % — ABNORMAL HIGH (ref 4.8–5.6)

## 2022-08-16 MED ORDER — BENAZEPRIL HCL 20 MG PO TABS: 20.0000 mg | ORAL_TABLET | Freq: Every day | ORAL | 0 refills | Status: DC

## 2022-08-16 MED ORDER — CLONAZEPAM 1 MG PO TABS: ORAL_TABLET | ORAL | 0 refills | Status: DC

## 2022-08-16 MED ORDER — ATENOLOL 100 MG PO TABS: 100.0000 mg | ORAL_TABLET | Freq: Every day | ORAL | 1 refills | Status: DC

## 2022-08-16 MED ORDER — OZEMPIC (0.25 OR 0.5 MG/DOSE) 2 MG/3ML ~~LOC~~ SOPN: 0.5000 mg | PEN_INJECTOR | SUBCUTANEOUS | 1 refills | Status: DC

## 2022-08-16 MED ORDER — ATORVASTATIN CALCIUM 20 MG PO TABS: 20.0000 mg | ORAL_TABLET | Freq: Every day | ORAL | 1 refills | Status: DC

## 2022-08-16 NOTE — Assessment & Plan Note (Signed)
Under good control on current regimen. Continue current regimen. Continue to monitor. Call with any concerns. Refills given. Labs drawn today.   

## 2022-08-16 NOTE — Assessment & Plan Note (Addendum)
Doing well with A1c of 6.3. Has continued to take his metformin. Will stop and increase his ozempic to 0.5mg . Recheck 3 months.

## 2022-08-16 NOTE — Progress Notes (Addendum)
BP (!) 146/87   Pulse 74   Temp 98.7 F (37.1 C) (Oral)   Ht 5' 10.9" (1.801 m)   Wt 270 lb 14.4 oz (122.9 kg)   SpO2 97%   BMI 37.89 kg/m    Subjective:    Patient ID: Aaron Robinson, male    DOB: 09-Jan-1971, 52 y.o.   MRN: [id number]  HPI: DADRIAN DIETZ is a 52 y.o. male presenting on 08/16/2022 for comprehensive medical examination. Current medical complaints include:  DIABETES Hypoglycemic episodes:no Polydipsia/polyuria: no Visual disturbance: no Chest pain: no Paresthesias: no Glucose Monitoring: no  Accucheck frequency: Not Checking Taking Insulin?: no Blood Pressure Monitoring: not checking Retinal Examination: Up to Date Foot Exam: Up to Date Diabetic Education: Completed Pneumovax: Up to Date Influenza: Not up to Date  HYPERTENSION / HYPERLIPIDEMIA Satisfied with current treatment? no Duration of hypertension: chronic BP monitoring frequency: occasionally BP range: [id number] systolic BP medication side effects: no Past BP meds: atenolol, benazepril Duration of hyperlipidemia: chronic Cholesterol medication side effects: no Cholesterol supplements: none Past cholesterol medications: atorvastatin Medication compliance: excellent compliance Aspirin: no Recent stressors: no Recurrent headaches: no Visual changes: no Palpitations: no Dyspnea: no Chest pain: no Lower extremity edema: no Dizzy/lightheaded: no  ANXIETY/DEPRESSION Duration: chronic Status:stable Anxious mood: yes  Excessive worrying: yes Irritability: no  Sweating: no Nausea: no Palpitations:no Hyperventilation: no Panic attacks: no Agoraphobia: no  Obscessions/compulsions: no Depressed mood: yes    08/16/2022    4:12 PM 05/11/2022    8:18 AM 02/09/2022    8:30 AM 10/20/2021    8:33 AM 07/20/2021    8:48 AM  Depression screen PHQ 2/9  Decreased Interest 0 0 0 1 1  Down, Depressed, Hopeless 0 0 0 1 0  PHQ - 2 Score 0 0 0 2 1  Altered sleeping 0 0 0 0 1  Tired,  decreased energy 1 0 0 1 1  Change in appetite 0 0 0 1 0  Feeling bad or failure about yourself  0 0 1 0 0  Trouble concentrating 0 0 0 1 0  Moving slowly or fidgety/restless 0 0 0 0 0  Suicidal thoughts 0 0 0 0 0  PHQ-9 Score 1 0 1 5 3   Difficult doing work/chores Not difficult at all Not difficult at all  Not difficult at all    Anhedonia: no Weight changes: no Insomnia: no   Hypersomnia: no Fatigue/loss of energy: no Feelings of worthlessness: no Feelings of guilt: no Impaired concentration/indecisiveness: no Suicidal ideations: no  Crying spells: no Recent Stressors/Life Changes: no   Relationship problems: no   Family stress: no     Financial stress: no    Job stress: no    Recent death/loss: no  Interim Problems from his last visit: no  Depression Screen done today and results listed below:     08/16/2022    4:12 PM 05/11/2022    8:18 AM 02/09/2022    8:30 AM 10/20/2021    8:33 AM 07/20/2021    8:48 AM  Depression screen PHQ 2/9  Decreased Interest 0 0 0 1 1  Down, Depressed, Hopeless 0 0 0 1 0  PHQ - 2 Score 0 0 0 2 1  Altered sleeping 0 0 0 0 1  Tired, decreased energy 1 0 0 1 1  Change in appetite 0 0 0 1 0  Feeling bad or failure about yourself  0 0 1 0 0  Trouble concentrating 0 0 0 1  0  Moving slowly or fidgety/restless 0 0 0 0 0  Suicidal thoughts 0 0 0 0 0  PHQ-9 Score 1 0 1 5 3   Difficult doing work/chores Not difficult at all Not difficult at all  Not difficult at all     Past Medical History:  Past Medical History:  Diagnosis Date   Anxiety    Depression    Diabetes mellitus without complication ([place])    GERD (gastroesophageal reflux disease)    Hyperlipidemia    Hypertension     Surgical History:  Past Surgical History:  Procedure Laterality Date   COLONOSCOPY WITH PROPOFOL N/A 07/11/2020   Procedure: COLONOSCOPY WITH PROPOFOL;  Surgeon: Virgel Manifold, MD;  Location: [facility] ENDOSCOPY;  Service: Endoscopy;  Laterality: N/A;   UPPER  GASTROINTESTINAL ENDOSCOPY      Medications:  Current Outpatient Medications on File Prior to Visit  Medication Sig   DULoxetine (CYMBALTA) 20 MG capsule Take 2 capsules (40 mg total) by mouth daily.   esomeprazole (NEXIUM) 40 MG capsule TAKE ONE CAPSULE BY MOUTH TWICE DAILY BEFORE A MEAL   sildenafil (VIAGRA) 100 MG tablet Take 0.5-1 tablets (50-100 mg total) by mouth daily as needed for erectile dysfunction. (Patient not taking: Reported on 08/16/2022)   No current facility-administered medications on file prior to visit.    Allergies:  No Known Allergies  Social History:  Social History   Socioeconomic History   Marital status: Single    Spouse name: Not on file   Number of children: Not on file   Years of education: Not on file   Highest education level: Not on file  Occupational History   Not on file  Tobacco Use   Smoking status: Former    Packs/day: .5    Types: Cigarettes    Quit date: 05/24/2000    Years since quitting: 22.2   Smokeless tobacco: Former    Types: Nurse, children's Use: Never used  Substance and Sexual Activity   Alcohol use: Yes    Alcohol/week: 12.0 - 15.0 standard drinks of alcohol    Types: 12 - 15 Cans of beer per week   Drug use: No   Sexual activity: Yes  Other Topics Concern   Not on file  Social History Narrative   Not on file   Social Determinants of Health   Financial Resource Strain: Not on file  Food Insecurity: Not on file  Transportation Needs: Not on file  Physical Activity: Not on file  Stress: Not on file  Social Connections: Not on file  Intimate Partner Violence: Not on file   Social History   Tobacco Use  Smoking Status Former   Packs/day: .5   Types: Cigarettes   Quit date: 05/24/2000   Years since quitting: 22.2  Smokeless Tobacco Former   Types: Loss adjuster, chartered   Social History   Substance and Sexual Activity  Alcohol Use Yes   Alcohol/week: 12.0 - 15.0 standard drinks of alcohol   Types: 12 - 15 Cans of  beer per week    Family History:  Family History  Problem Relation Age of Onset   Hypertension Mother    Hypertension Father    Heart disease Father    Heart attack Father    Cancer Paternal Uncle        throat   Heart attack Paternal Grandfather     Past medical history, surgical history, medications, allergies, family history and social history reviewed with patient today  and changes made to appropriate areas of the chart.   Review of Systems  Constitutional: Negative.   HENT: Negative.    Eyes: Negative.   Respiratory: Negative.    Gastrointestinal:  Positive for heartburn. Negative for abdominal pain, blood in stool, constipation, diarrhea, melena, nausea and vomiting.  Genitourinary: Negative.   Musculoskeletal: Negative.   Skin: Negative.   Neurological:  Positive for dizziness. Negative for tingling, tremors, sensory change, speech change, focal weakness, seizures, loss of consciousness, weakness and headaches.  Endo/Heme/Allergies: Negative.   Psychiatric/Behavioral: Negative.     All other ROS negative except what is listed above and in the HPI.      Objective:    BP (!) 146/87   Pulse 74   Temp 98.7 F (37.1 C) (Oral)   Ht 5' 10.9" (1.801 m)   Wt 270 lb 14.4 oz (122.9 kg)   SpO2 97%   BMI 37.89 kg/m   Wt Readings from Last 3 Encounters:  08/16/22 270 lb 14.4 oz (122.9 kg)  05/11/22 280 lb 1.6 oz (127.1 kg)  02/09/22 277 lb 3.2 oz (125.7 kg)    Physical Exam Vitals and nursing note reviewed.  Constitutional:      General: He is not in acute distress.    Appearance: Normal appearance. He is obese. He is not ill-appearing, toxic-appearing or diaphoretic.  HENT:     Head: Normocephalic and atraumatic.     Right Ear: Tympanic membrane, ear canal and external ear normal. There is no impacted cerumen.     Left Ear: Tympanic membrane, ear canal and external ear normal. There is no impacted cerumen.     Nose: Nose normal. No congestion or rhinorrhea.      Mouth/Throat:     Mouth: Mucous membranes are moist.     Pharynx: Oropharynx is clear. No oropharyngeal exudate or posterior oropharyngeal erythema.  Eyes:     General: No scleral icterus.       Right eye: No discharge.        Left eye: No discharge.     Extraocular Movements: Extraocular movements intact.     Conjunctiva/sclera: Conjunctivae normal.     Pupils: Pupils are equal, round, and reactive to light.  Neck:     Vascular: No carotid bruit.  Cardiovascular:     Rate and Rhythm: Normal rate and regular rhythm.     Pulses: Normal pulses.     Heart sounds: No murmur heard.    No friction rub. No gallop.  Pulmonary:     Effort: Pulmonary effort is normal. No respiratory distress.     Breath sounds: Normal breath sounds. No stridor. No wheezing, rhonchi or rales.  Chest:     Chest wall: No tenderness.  Abdominal:     General: Abdomen is flat. Bowel sounds are normal. There is no distension.     Palpations: Abdomen is soft. There is no mass.     Tenderness: There is no abdominal tenderness. There is no right CVA tenderness, left CVA tenderness, guarding or rebound.     Hernia: No hernia is present.  Genitourinary:    Comments: Genital exam deferred with shared decision making Musculoskeletal:        General: No swelling, tenderness, deformity or signs of injury.     Cervical back: Normal range of motion and neck supple. No rigidity. No muscular tenderness.     Right lower leg: No edema.     Left lower leg: No edema.  Lymphadenopathy:  Cervical: No cervical adenopathy.  Skin:    General: Skin is warm and dry.     Capillary Refill: Capillary refill takes less than 2 seconds.     Coloration: Skin is not jaundiced or pale.     Findings: No bruising, erythema, lesion or rash.  Neurological:     General: No focal deficit present.     Mental Status: He is alert and oriented to person, place, and time.     Cranial Nerves: No cranial nerve deficit.     Sensory: No sensory  deficit.     Motor: No weakness.     Coordination: Coordination normal.     Gait: Gait normal.     Deep Tendon Reflexes: Reflexes normal.  Psychiatric:        Mood and Affect: Mood normal.        Behavior: Behavior normal.        Thought Content: Thought content normal.        Judgment: Judgment normal.     Results for orders placed or performed in visit on 08/16/22  Microscopic Examination   Urine  Result Value Ref Range   WBC, UA 0-5 0 - 5 /hpf   RBC, Urine None seen 0 - 2 /hpf   Epithelial Cells (non renal) None seen 0 - 10 /hpf   Mucus, UA Present (A) Not Estab.   Bacteria, UA None seen None seen/Few  Urinalysis, Routine w reflex microscopic  Result Value Ref Range   Specific Gravity, UA 1.025 1.005 - 1.030   pH, UA 5.5 5.0 - 7.5   Color, UA Yellow Yellow   Appearance Ur Clear Clear   Leukocytes,UA Negative Negative   Protein,UA 1+ (A) Negative/Trace   Glucose, UA Negative Negative   Ketones, UA Negative Negative   RBC, UA Negative Negative   Bilirubin, UA Negative Negative   Urobilinogen, Ur 1.0 0.2 - 1.0 mg/dL   Nitrite, UA Negative Negative   Microscopic Examination See below:   PSA  Result Value Ref Range   Prostate Specific Ag, Serum 0.9 0.0 - 4.0 ng/mL  Microalbumin, Urine Waived  Result Value Ref Range   Microalb, Ur Waived 80 (H) 0 - 19 mg/L   Creatinine, Urine Waived 200 10 - 300 mg/dL   Microalb/Creat Ratio 30-300 (H) <30 mg/g  Lipid Panel w/o Chol/HDL Ratio  Result Value Ref Range   Cholesterol, Total 109 100 - 199 mg/dL   Triglycerides 89 0 - 149 mg/dL   HDL 40 >39 mg/dL   VLDL Cholesterol Cal 17 5 - 40 mg/dL   LDL Chol Calc (NIH) 52 0 - 99 mg/dL  Homocysteine  Result Value Ref Range   Homocysteine 12.6 0.0 - 14.5 umol/L  Comprehensive metabolic panel  Result Value Ref Range   Glucose 107 (H) 70 - 99 mg/dL   BUN 8 6 - 24 mg/dL   Creatinine, Ser 0.84 0.76 - 1.27 mg/dL   eGFR 105 >59 mL/min/1.73   BUN/Creatinine Ratio 10 9 - 20   Sodium 138  134 - 144 mmol/L   Potassium 4.1 3.5 - 5.2 mmol/L   Chloride 100 96 - 106 mmol/L   CO2 22 20 - 29 mmol/L   Calcium 9.1 8.7 - 10.2 mg/dL   Total Protein 7.0 6.0 - 8.5 g/dL   Albumin 4.1 3.8 - 4.9 g/dL   Globulin, Total 2.9 1.5 - 4.5 g/dL   Albumin/Globulin Ratio 1.4 1.2 - 2.2   Bilirubin Total 0.4 0.0 - 1.2 mg/dL  Alkaline Phosphatase 76 44 - 121 IU/L   AST 19 0 - 40 IU/L   ALT 25 0 - 44 IU/L  CBC with Differential/Platelet  Result Value Ref Range   WBC 6.5 3.4 - 10.8 x10E3/uL   RBC 6.82 (H) 4.14 - 5.80 x10E6/uL   Hemoglobin 17.6 13.0 - 17.7 g/dL   Hematocrit 55.4 (H) 37.5 - 51.0 %   MCV 81 79 - 97 fL   MCH 25.8 (L) 26.6 - 33.0 pg   MCHC 31.8 31.5 - 35.7 g/dL   RDW 14.7 11.6 - 15.4 %   Platelets 197 150 - 450 x10E3/uL   Neutrophils 70 Not Estab. %   Lymphs 19 Not Estab. %   Monocytes 6 Not Estab. %   Eos 4 Not Estab. %   Basos 1 Not Estab. %   Neutrophils Absolute 4.5 1.4 - 7.0 x10E3/uL   Lymphocytes Absolute 1.2 0.7 - 3.1 x10E3/uL   Monocytes Absolute 0.4 0.1 - 0.9 x10E3/uL   EOS (ABSOLUTE) 0.3 0.0 - 0.4 x10E3/uL   Basophils Absolute 0.0 0.0 - 0.2 x10E3/uL   Immature Granulocytes 0 Not Estab. %   Immature Grans (Abs) 0.0 0.0 - 0.1 x10E3/uL  Bayer DCA Hb A1c Waived  Result Value Ref Range   HB A1C (BAYER DCA - WAIVED) 6.3 (H) 4.8 - 5.6 %  TSH  Result Value Ref Range   TSH 1.420 0.450 - 4.500 uIU/mL      Assessment & Plan:   Problem List Items Addressed This Visit       Cardiovascular and Mediastinum   Hypertension    Has been running high. Will increase his benazepril to 20mg  and recheck 3 months. Call with any concerns.       Relevant Medications   atorvastatin (LIPITOR) 20 MG tablet   benazepril (LOTENSIN) 20 MG tablet   atenolol (TENORMIN) 100 MG tablet   Other Relevant Orders   CBC with Differential/Platelet (Completed)   Comprehensive metabolic panel (Completed)   Homocysteine (Completed)   Microalbumin, Urine Waived (Completed)   TSH   Urinalysis,  Routine w reflex microscopic (Completed)     Digestive   GERD (gastroesophageal reflux disease)    Under good control on current regimen. Continue current regimen. Continue to monitor. Call with any concerns. Refills given. Labs drawn today.        Relevant Orders   CBC with Differential/Platelet (Completed)   Comprehensive metabolic panel (Completed)   Homocysteine (Completed)     Endocrine   Controlled diabetes mellitus type 2 with complications    Doing well with A1c of 6.3. Has continued to take his metformin. Will stop and increase his ozempic to 0.5mg . Recheck 3 months.       Relevant Medications   Semaglutide,0.25 or 0.5MG /DOS, (OZEMPIC, 0.25 OR 0.5 MG/DOSE,) 2 MG/3ML SOPN   atorvastatin (LIPITOR) 20 MG tablet   benazepril (LOTENSIN) 20 MG tablet   Other Relevant Orders   Bayer DCA Hb A1c Waived (Completed)   CBC with Differential/Platelet (Completed)   Comprehensive metabolic panel (Completed)   Homocysteine (Completed)   Lipid Panel w/o Chol/HDL Ratio (Completed)     Other   Anxiety    Under good control on current regimen. Continue current regimen. Continue to monitor. Call with any concerns. Refills given. Clonazepam rx should last about 3 months. Call with any concerns.         Relevant Medications   clonazePAM (KLONOPIN) 1 MG tablet   Other Relevant Orders   CBC with  Differential/Platelet (Completed)   Comprehensive metabolic panel (Completed)   Homocysteine (Completed)   Hyperlipidemia    Under good control on current regimen. Continue current regimen. Continue to monitor. Call with any concerns. Refills given. Labs drawn today.       Relevant Medications   atorvastatin (LIPITOR) 20 MG tablet   benazepril (LOTENSIN) 20 MG tablet   atenolol (TENORMIN) 100 MG tablet   Other Relevant Orders   CBC with Differential/Platelet (Completed)   Comprehensive metabolic panel (Completed)   Homocysteine (Completed)   Lipid Panel w/o Chol/HDL Ratio (Completed)    Major depressive disorder, recurrent episode, moderate    Under good control on current regimen. Continue current regimen. Continue to monitor. Call with any concerns. Refills given. Clonazepam rx should last about 3 months. Call with any concerns.        Morbid obesity    Encouraged diet and exercise with goal of losing 1-2lbs per week. Call with any concerns.       Relevant Medications   Semaglutide,0.25 or 0.5MG /DOS, (OZEMPIC, 0.25 OR 0.5 MG/DOSE,) 2 MG/3ML SOPN   Other Visit Diagnoses     Routine general medical examination at a health care facility    -  Primary   Vaccines up to date. Screening labs checked today. Colonoscopy up to date. Continue diet and exercise. Call with any concerns.   Sciatica of right side       Will get x-ray and refer to PT. Call with any concerns.   Relevant Medications   clonazePAM (KLONOPIN) 1 MG tablet   Other Relevant Orders   Ambulatory referral to Physical Therapy   DG Lumbar Spine Complete   Screening for prostate cancer       Labs drawn today. Await results.   Relevant Orders   Homocysteine (Completed)   PSA (Completed)       LABORATORY TESTING:  Health maintenance labs ordered today as discussed above.   The natural history of prostate cancer and ongoing controversy regarding screening and potential treatment outcomes of prostate cancer has been discussed with the patient. The meaning of a false positive PSA and a false negative PSA has been discussed. He indicates understanding of the limitations of this screening test and wishes to proceed with screening PSA testing.   IMMUNIZATIONS:   - Tdap: Tetanus vaccination status reviewed: last tetanus booster within 10 years. - Influenza: Refused - Pneumovax: Up to date - Prevnar: Not applicable - COVID: Up to date - HPV: Not applicable - Shingrix vaccine: Refused  SCREENING: - Colonoscopy: Up to date  Discussed with patient purpose of the colonoscopy is to detect colon cancer at  curable precancerous or early stages   PATIENT COUNSELING:    Sexuality: Discussed sexually transmitted diseases, partner selection, use of condoms, avoidance of unintended pregnancy  and contraceptive alternatives.   Advised to avoid cigarette smoking.  I discussed with the patient that most people either abstain from alcohol or drink within safe limits (<=14/week and <=4 drinks/occasion for males, <=7/weeks and <= 3 drinks/occasion for females) and that the risk for alcohol disorders and other health effects rises proportionally with the number of drinks per week and how often a drinker exceeds daily limits.  Discussed cessation/primary prevention of drug use and availability of treatment for abuse.   Diet: Encouraged to adjust caloric intake to maintain  or achieve ideal body weight, to reduce intake of dietary saturated fat and total fat, to limit sodium intake by avoiding high sodium foods and not adding  table salt, and to maintain adequate dietary potassium and calcium preferably from fresh fruits, vegetables, and low-fat dairy products.    stressed the importance of regular exercise  Injury prevention: Discussed safety belts, safety helmets, smoke detector, smoking near bedding or upholstery.   Dental health: Discussed importance of regular tooth brushing, flossing, and dental visits.   Follow up plan: NEXT PREVENTATIVE PHYSICAL DUE IN 1 YEAR. Return in about 3 months (around 11/16/2022) for BP, cholesterol and DM.

## 2022-08-16 NOTE — Assessment & Plan Note (Signed)
Has been running high. Will increase his benazepril to 20mg  and recheck 3 months. Call with any concerns.

## 2022-08-17 LAB — COMPREHENSIVE METABOLIC PANEL
ALT: 25 IU/L (ref 0–44)
AST: 19 IU/L (ref 0–40)
Albumin/Globulin Ratio: 1.4 (ref 1.2–2.2)
Albumin: 4.1 g/dL (ref 3.8–4.9)
Alkaline Phosphatase: 76 IU/L (ref 44–121)
BUN/Creatinine Ratio: 10 (ref 9–20)
BUN: 8 mg/dL (ref 6–24)
Bilirubin Total: 0.4 mg/dL (ref 0.0–1.2)
CO2: 22 mmol/L (ref 20–29)
Calcium: 9.1 mg/dL (ref 8.7–10.2)
Chloride: 100 mmol/L (ref 96–106)
Creatinine, Ser: 0.84 mg/dL (ref 0.76–1.27)
Globulin, Total: 2.9 g/dL (ref 1.5–4.5)
Glucose: 107 mg/dL — ABNORMAL HIGH (ref 70–99)
Potassium: 4.1 mmol/L (ref 3.5–5.2)
Sodium: 138 mmol/L (ref 134–144)
Total Protein: 7 g/dL (ref 6.0–8.5)
eGFR: 105 mL/min/{1.73_m2} (ref >59)

## 2022-08-17 LAB — CBC WITH DIFFERENTIAL/PLATELET
Basophils Absolute: 0 10*3/uL (ref 0.0–0.2)
Basos: 1 %
EOS (ABSOLUTE): 0.3 10*3/uL (ref 0.0–0.4)
Eos: 4 %
Hematocrit: 55.4 % — ABNORMAL HIGH (ref 37.5–51.0)
Hemoglobin: 17.6 g/dL (ref 13.0–17.7)
Immature Grans (Abs): 0 10*3/uL (ref 0.0–0.1)
Immature Granulocytes: 0 %
Lymphocytes Absolute: 1.2 10*3/uL (ref 0.7–3.1)
Lymphs: 19 %
MCH: 25.8 pg — ABNORMAL LOW (ref 26.6–33.0)
MCHC: 31.8 g/dL (ref 31.5–35.7)
MCV: 81 fL (ref 79–97)
Monocytes Absolute: 0.4 10*3/uL (ref 0.1–0.9)
Monocytes: 6 %
Neutrophils Absolute: 4.5 10*3/uL (ref 1.4–7.0)
Neutrophils: 70 %
Platelets: 197 10*3/uL (ref 150–450)
RBC: 6.82 x10E6/uL — ABNORMAL HIGH (ref 4.14–5.80)
RDW: 14.7 % (ref 11.6–15.4)
WBC: 6.5 10*3/uL (ref 3.4–10.8)

## 2022-08-17 LAB — LIPID PANEL W/O CHOL/HDL RATIO
Cholesterol, Total: 109 mg/dL (ref 100–199)
HDL: 40 mg/dL (ref >39)
LDL Chol Calc (NIH): 52 mg/dL (ref 0–99)
Triglycerides: 89 mg/dL (ref 0–149)
VLDL Cholesterol Cal: 17 mg/dL (ref 5–40)

## 2022-08-17 LAB — PSA: Prostate Specific Ag, Serum: 0.9 ng/mL (ref 0.0–4.0)

## 2022-08-17 LAB — TSH: TSH: 1.42 u[IU]/mL (ref 0.450–4.500)

## 2022-08-17 LAB — HOMOCYSTEINE: Homocysteine: 12.6 umol/L (ref 0.0–14.5)

## 2022-08-17 NOTE — Telephone Encounter (Signed)
Requested medication (s) are due for refill today: yes  Requested medication (s) are on the active medication list: yes  Last refill:  unknown  Future visit scheduled:yes  Notes to clinic:  Unable to refill per protocol, Rx expired. Medication is not on current medication list, routing for approval.      Requested Prescriptions  Pending Prescriptions Disp Refills   metFORMIN (GLUCOPHAGE-XR) 500 MG 24 hr tablet [Pharmacy Med Name: METFORMIN HCL ER 500MG  TB24] 180 tablet 1    Sig: TAKE 1 TABLET BY MOUTH IN THE MORNING AND AT BEDTIME     Endocrinology:  Diabetes - Biguanides Failed - 08/16/2022  9:51 AM      Failed - B12 Level in normal range and within 720 days    No results found for: "VITAMINB12"       Passed - Cr in normal range and within 360 days    Creatinine, Ser  Date Value Ref Range Status  08/16/2022 0.84 0.76 - 1.27 mg/dL Final         Passed - HBA1C is between 0 and 7.9 and within 180 days    HB A1C (BAYER DCA - WAIVED)  Date Value Ref Range Status  08/16/2022 6.3 (H) 4.8 - 5.6 % Final    Comment:             Prediabetes: 5.7 - 6.4          Diabetes: >6.4          Glycemic control for adults with diabetes: <7.0          Passed - eGFR in normal range and within 360 days    GFR calc Af Amer  Date Value Ref Range Status  06/25/2020 117 >59 mL/min/1.73 Final    Comment:    **In accordance with recommendations from the NKF-ASN Task force,**   [facility] is in the process of updating its eGFR calculation to the   2021 CKD-EPI creatinine equation that estimates kidney function   without a race variable.    GFR, Estimated  Date Value Ref Range Status  03/03/2021 >60 >60 mL/min Final    Comment:    (NOTE) Calculated using the CKD-EPI Creatinine Equation (2021)    eGFR  Date Value Ref Range Status  08/16/2022 105 >59 mL/min/1.73 Final         Passed - Valid encounter within last 6 months    Recent Outpatient Visits           Yesterday Primary  hypertension   [place], [place], DO   3 months ago Controlled type 2 diabetes mellitus with complication, without long-term current use of insulin ([place])   [place], DO   6 months ago Controlled type 2 diabetes mellitus with complication, without long-term current use of insulin ([place])   [place], Megan P, DO   10 months ago Controlled type 2 diabetes mellitus with complication, without long-term current use of insulin ([place])   [place], DO   1 year ago Routine general medical examination at a health care facility   [facility] Valerie Roys, DO       Future Appointments             In 3 months Valerie Roys, DO [place] [place]   In 1 year Ralene Bathe, MD [facility]  [place] within normal limits and completed in the last 12 months    WBC  Date Value Ref Range Status  08/16/2022 6.5 3.4 - 10.8 x10E3/uL Final   RBC  Date Value Ref Range Status  08/16/2022 6.82 (H) 4.14 - 5.80 x10E6/uL Final   Hemoglobin  Date Value Ref Range Status  08/16/2022 17.6 13.0 - 17.7 g/dL Final   Hematocrit  Date Value Ref Range Status  08/16/2022 55.4 (H) 37.5 - 51.0 % Final   MCHC  Date Value Ref Range Status  08/16/2022 31.8 31.5 - 35.7 g/dL Final   [facility]  Date Value Ref Range Status  08/16/2022 25.8 (L) 26.6 - 33.0 pg Final   MCV  Date Value Ref Range Status  08/16/2022 81 79 - 97 fL Final   No results found for: "PLTCOUNTKUC", "LABPLAT", "POCPLA" RDW  Date Value Ref Range Status  08/16/2022 14.7 11.6 - 15.4 % Final

## 2022-08-18 NOTE — Assessment & Plan Note (Signed)
Under good control on current regimen. Continue current regimen. Continue to monitor. Call with any concerns. Refills given. Clonazepam rx should last about 3 months. Call with any concerns.

## 2022-08-18 NOTE — Assessment & Plan Note (Signed)
Encouraged diet and exercise with goal of losing 1-2lbs per week. Call with any concerns.  

## 2022-09-14 ENCOUNTER — Ambulatory Visit: Payer: 59 | Attending: Family Medicine

## 2022-09-14 DIAGNOSIS — G8929 Other chronic pain: Secondary | ICD-10-CM | POA: Diagnosis present

## 2022-09-14 DIAGNOSIS — R262 Difficulty in walking, not elsewhere classified: Secondary | ICD-10-CM | POA: Diagnosis present

## 2022-09-14 DIAGNOSIS — M5441 Lumbago with sciatica, right side: Secondary | ICD-10-CM | POA: Diagnosis present

## 2022-09-14 DIAGNOSIS — M5431 Sciatica, right side: Secondary | ICD-10-CM | POA: Diagnosis present

## 2022-09-14 NOTE — Therapy (Signed)
[facility] [address] [place], [address] Phone: [phone]   Fax:  [phone]  Physical Therapy Evaluation  Patient Details  Name: Aaron Robinson MRN: [id number] Date of Birth: [birth year] Referring Provider (PT): Dorcas Carrow, [place]   Encounter Date: 09/14/2022   PT End of Session - 09/14/22 1636     Visit Number 1    Number of Visits 17    Date for PT Re-Evaluation 11/12/22    Authorization - Number of Visits 60    PT Start Time 1636    PT Stop Time 1725    PT Time Calculation (min) 49 min    Activity Tolerance Patient tolerated treatment well    Behavior During Therapy [facility] for tasks assessed/performed             Past Medical History:  Diagnosis Date   Anxiety    Depression    Diabetes mellitus without complication (HCC)    GERD (gastroesophageal reflux disease)    Hyperlipidemia    Hypertension     Past Surgical History:  Procedure Laterality Date   COLONOSCOPY WITH PROPOFOL N/A 07/11/2020   Procedure: COLONOSCOPY WITH PROPOFOL;  Surgeon: Pasty Spillers, MD;  Location: [facility] ENDOSCOPY;  Service: Endoscopy;  Laterality: N/A;   UPPER GASTROINTESTINAL ENDOSCOPY      There were no vitals filed for this visit.    Subjective Assessment - 09/14/22 1643     Subjective R posterior hip and thigh: 0/10 currently (pt sitting), 6/10 at worst for the past 3 months. R low back pain: 0/10 currently, 7/10 at most for the past 3 months.    Pertinent History Sciatica of right side.  Pain mainly happens when sitting in an airplane or confined space. Pain starts from R low back to R LE along the sciatic nerve distribution, not past the knee. Standing causes his R anterior lateral thigh feel numb with low back pain. Pain started about a year ago, gradual onset. Pain has worsened since onset. Denies loss of bowel or bladder control or saddle anesthesia. Has not yet had PT for current condition. R hip pain  started before back pain.    Patient Stated Goals Sit more comfortably in an airplane, no low back pain, be able to stand without thigh numbness.    Currently in Pain? No/denies    Pain Location Buttocks    Pain Orientation Right    Pain Descriptors / Indicators Constant;Dull;Aching    Pain Type Chronic pain    Pain Radiating Towards R sciatic distribution, not past the knee and R lateral femoral cutaneous nerve.    Pain Onset More than a month ago    Pain Frequency Occasional    Aggravating Factors  R LE: Sitting for about 5 minutes or more in confined spaces (hips in adduction), prolonged standing (about 15-20 minutes causes anterior lateral thigh numbness and R low back pain.    Pain Relieving Factors R hip extension; R low back: L side bend and R rotation                [facility] PT Assessment - 09/14/22 1655       Assessment   Medical Diagnosis M54.31 (ICD-10-CM) - Sciatica of right side    Referring Provider (PT) Dorcas Carrow, DO    Onset Date/Surgical Date 08/16/22   Date PT referral signed. Chronic condition   Prior Therapy None for current condition.      Precautions  Precaution Comments No known precautions      Restrictions   Other Position/Activity Restrictions No known restrictions      Posture/Postural Control   Posture Comments Slight B shoulder shrug, B scapular protraction, movement crease around L4/L5, R lateral shift around that area, R rotation of upper thoracolumbar area, L rotation lumbar area,      AROM   Lumbar Flexion WFL with R low back pain    Lumbar Extension WFL with low back pressure    Lumbar - Right Side Bend WFL    Lumbar - Left Side Bend WFL    Lumbar - Right Rotation WFL    Lumbar - Left Rotation WFL      PROM   Right Hip Internal Rotation  12    Left Hip Internal Rotation  23      Strength   Right Hip Flexion 4-/5    Right Hip Extension 4-/5    Right Hip ABduction 4/5    Left Hip Flexion 4-/5    Left Hip Extension 4/5     Left Hip ABduction 4/5    Right Knee Flexion 5/5    Right Knee Extension 5/5    Left Knee Flexion 4+/5    Left Knee Extension 5/5      Palpation   Palpation comment TTP R posterior hip and proximal posterior thigh along sciatic nerve. TTP R L4 TP with local tenderness, no R LE symptom reproduction      Special Tests   Other special tests (-) Slump B LE; (+) piriformis test R with reproduction of symptoms.      Ambulation/Gait   Gait Comments Decreased stance L LE, decreased trunk rotatoin                        Objective measurements completed on examination: See above findings.   Blood pressure L arm sitting, normal cuff, mechanically taken: 152/90, HR 88  (-) Slump B LE; (+) piriformis test R with reproduction of symptoms.  TTP R posterior hip and proximal posterior thigh along sciatic nerve. TTP R L4 TP with local tenderness, no R LE symptom reproduction  Therapeutic exercise Supine R piriformis stretch self, using contralateral foot   R 1 minute x 3  Reviewed and given as part of his HEP. Pt demonstrated and verbalized understanding.   Improved exercise technique, movement at target joints, use of target muscles after mod verbal, visual, tactile cues.     Response to treatment Pt tolerated session well without aggravation of symptoms.     Clinical impression  Pt is a 52 year old male who came to physical therapy secondary to chronic R posterior hip, thigh pain and anterior lateral thigh paresthesia. He also presents with altered gait pattern and posture, TTP, R piriformis tightness, limited R hip IR rotation, positive special tests suggesting piriformis involvement; decreased R glute med and max strength, and difficulty tolerating prolonged sitting, standing, and walking due to pain. Pt will benefit from skilled physical therapy services to address the aforementioned deficits.     HEP Supine R piriformis stretch self, using contralateral foot   R 1  minute x 3 for 3  times daily.                    PT Education - 09/14/22 1946     Education Details ther-ex, HEP, POC    Person(s) Educated Patient    Methods Explanation;Demonstration;Tactile cues;Verbal cues  Comprehension Returned demonstration;Verbalized understanding              PT Short Term Goals - 09/14/22 1937       PT SHORT TERM GOAL #1   Title Pt will be independent with his initial HEP to decrease pain, improve strength, function, and ability to tolerate sitting and standing.    Baseline Pt has started his HEP (09/14/2022)    Time 3    Period Weeks    Status New    Target Date 10/08/22               PT Long Term Goals - 09/14/22 1938       PT LONG TERM GOAL #1   Title Pt will have a decrease in R LE pain to 3/10 or less at worst to promote ability to tolerate sitting, standing, and walking.    Baseline 6/10 R LE pain at worst for the past 3 months (09/14/2022)    Time 8    Period Weeks    Status New    Target Date 11/12/22      PT LONG TERM GOAL #2   Title Pt will have a decrease in low back pain to 3/10 or less at worst to promote ability to sit, stand, walk more comfortably.    Baseline 7/10 low back pain at worst for the past 3 months (09/14/2022)    Time 8    Period Weeks    Status New    Target Date 11/12/22      PT LONG TERM GOAL #3   Title Pt will improve R hip IR PROM by at least 10 degrees to promote ability to stand, sit, ambulate more comfortably.    Baseline R hip PROM 12 degrees (09/14/2022)    Time 8    Period Weeks    Status New    Target Date 11/12/22      PT LONG TERM GOAL #4   Title Pt will improve R hip extension and abduction strength by at least 1/2 MMT grade to promote ability to sit, stand, and ambulate more comfortably.    Baseline R hip extension 4-/5, abduction 4/5 (09/14/2022)    Time 8    Period Weeks    Status New    Target Date 11/12/22      PT LONG TERM GOAL #5   Title Pt will improve his  lumbar spine FOTO by at least 10 points as a demonstration of improved function.    Baseline Lumbar Spine FOTO 55 (09/14/2022)    Time 8    Period Weeks    Status New    Target Date 11/12/22                    Plan - 09/14/22 1932     Clinical Impression Statement Pt is a 52 year old male who came to physical therapy secondary to chronic R posterior hip, thigh pain and anterior lateral thigh paresthesia. He also presents with altered gait pattern and posture, TTP, R piriformis tightness, limited R hip IR rotation, positive special tests suggesting piriformis involvement; decreased R glute med and max strength, and difficulty tolerating prolonged sitting, standing, and walking due to pain. Pt will benefit from skilled physical therapy services to address the aforementioned deficits.    Personal Factors and Comorbidities Comorbidity 3+;Time since onset of injury/illness/exacerbation;Profession;Fitness    Comorbidities Anxiety, depression, DM, HTN    Examination-Activity Limitations Stand;Sit;Locomotion Level    Stability/Clinical Decision  Making Evolving/Moderate complexity   Pain has worsened since onset   Clinical Decision Making Moderate    Rehab Potential Fair    PT Frequency 2x / week    PT Duration 8 weeks    PT Treatment/Interventions Manual techniques;Patient/family education;Therapeutic exercise;Neuromuscular re-education;Therapeutic activities;Traction;Iontophoresis /ml Dexamethasone;Electrical Stimulation;Dry needling;Spinal Manipulations;Joint Manipulations    PT Next Visit Plan Posture, improve hip mobility, trunk and glute strengthening, manual techniques, modalities PRN    Consulted and Agree with Plan of Care Patient             Patient will benefit from skilled therapeutic intervention in order to improve the following deficits and impairments:  Pain, Postural dysfunction, Improper body mechanics, Decreased strength, Difficulty walking, Decreased range of  motion  Visit Diagnosis: Sciatica, right side - Plan: PT plan of care cert/re-cert  Chronic low back pain with right-sided sciatica, unspecified back pain laterality - Plan: PT plan of care cert/re-cert  Difficulty in walking, not elsewhere classified - Plan: PT plan of care cert/re-cert     Problem List Patient Active Problem List   Diagnosis Date Noted   Migraine aura without headache 10/20/2021   Osteoarthritis of spine with radiculopathy, cervical region 07/20/2021   Morbid obesity 12/25/2020   Polyp of colon    OSA on CPAP 12/08/2017   Controlled diabetes mellitus type 2 with complications 08/19/2016   Anxiety    GERD (gastroesophageal reflux disease)    Hypertension    Hyperlipidemia    Major depressive disorder, recurrent episode, moderate    Loralyn Freshwater PT, DPT  09/14/2022, 7:53 PM  [facility] [address] [place], [address] Phone: [phone]   Fax:  [phone]  Name: Aaron Robinson MRN: [id number] Date of Birth: February 28, 1971

## 2022-09-16 ENCOUNTER — Ambulatory Visit: Payer: 59

## 2022-09-16 DIAGNOSIS — G8929 Other chronic pain: Secondary | ICD-10-CM

## 2022-09-16 DIAGNOSIS — R262 Difficulty in walking, not elsewhere classified: Secondary | ICD-10-CM

## 2022-09-16 DIAGNOSIS — M5431 Sciatica, right side: Secondary | ICD-10-CM | POA: Diagnosis not present

## 2022-09-16 NOTE — Therapy (Signed)
OUTPATIENT PHYSICAL THERAPY TREATMENT NOTE   Patient Name: Aaron Robinson MRN: [id number] DOB:[birth year], 52 y.o., male Today's Date: 09/16/2022  PCP: Dorcas Carrow, DO  REFERRING PROVIDER: Dorcas Carrow, DO   END OF SESSION:  PT End of Session - 09/16/22 1637     Visit Number 2    Number of Visits 17    Date for PT Re-Evaluation 11/12/22    Authorization - Number of Visits 60    PT Start Time 1637   pt arrived late   PT Stop Time 1717    PT Time Calculation (min) 40 min    Activity Tolerance Patient tolerated treatment well    Behavior During Therapy WFL for tasks assessed/performed             Past Medical History:  Diagnosis Date   Anxiety    Depression    Diabetes mellitus without complication (HCC)    GERD (gastroesophageal reflux disease)    Hyperlipidemia    Hypertension    Past Surgical History:  Procedure Laterality Date   COLONOSCOPY WITH PROPOFOL N/A 07/11/2020   Procedure: COLONOSCOPY WITH PROPOFOL;  Surgeon: Pasty Spillers, MD;  Location: [facility] ENDOSCOPY;  Service: Endoscopy;  Laterality: N/A;   UPPER GASTROINTESTINAL ENDOSCOPY     Patient Active Problem List   Diagnosis Date Noted   Migraine aura without headache 10/20/2021   Osteoarthritis of spine with radiculopathy, cervical region 07/20/2021   Morbid obesity 12/25/2020   Polyp of colon    OSA on CPAP 12/08/2017   Controlled diabetes mellitus type 2 with complications 08/19/2016   Anxiety    GERD (gastroesophageal reflux disease)    Hypertension    Hyperlipidemia    Major depressive disorder, recurrent episode, moderate     REFERRING DIAG: M54.31 (ICD-10-CM) - Sciatica of right side   THERAPY DIAG:  Sciatica, right side  Chronic low back pain with right-sided sciatica, unspecified back pain laterality  Difficulty in walking, not elsewhere classified  Rationale for Evaluation and Treatment Rehabilitation  PERTINENT HISTORY: Sciatica of right side. Pain mainly  happens when sitting in an airplane or confined space. Pain starts from R low back to R LE along the sciatic nerve distribution, not past the knee. Standing causes his R anterior lateral thigh feel numb with low back pain. Pain started about a year ago, gradual onset. Pain has worsened since onset. Denies loss of bowel or bladder control or saddle anesthesia. Has not yet had PT for current condition. R hip pain started before back pain.   PRECAUTIONS: No known precautions   SUBJECTIVE:   SUBJECTIVE STATEMENT: R LE has not bothered him that much. Had a little bit of R thigh numbness and tingling yesterday due to standing on concrete for about 40-45 minutes. No R LE pain currently.      PAIN:  Are you having pain? See subjective   TODAY'S TREATMENT:  DATE: 09/16/2022     (-) Slump B LE; (+) piriformis test R with reproduction of symptoms.  TTP R posterior hip and proximal posterior thigh along sciatic nerve. TTP R L4 TP with local tenderness, no R LE symptom reproduction      Therapeutic exercise  Self posterior hip capsule stretch   R unable to perform secondary to knee pain.   Quadruped Rockback Posterior Hip Capsule Stretch  Pillow under knees  R 30 seconds x 3  Supine R piriformis stretch using towel (R knee to L shoulder)  R 30 seconds x 3   Supine R piriformis stretch self, using contralateral foot              R 1 minute x 3   Supine long axis traction R hip to decrease pressure 10x10 seconds  No change in R anterior lateral thigh paresthesia  Seated trunk flexion stretch 10x10 seconds   Decreased R anterior lateral thigh paresthesia  Standing static mini lunge targeting glute max muscle  R knee pain from Osgood schlatter's   Standing bent over hip extension over table   R 3x5 seconds    R glute pain  Prone glute max  extension   R 1x. R glute max twitching reported  Prone glute max set   R 10x5 seconds for 2 sets  L 10x5 seconds for 2 sets   Relieves pain per pt.     Improved exercise technique, movement at target joints, use of target muscles after mod verbal, visual, tactile cues.        Response to treatment Pt tolerated session well without aggravation of symptoms.        Clinical impression   Worked on hip mobility to decrease pressure to sciatic nerve.  Decreased R anterior lateral thigh paresthesia with activation of glute max muscles in prone. Difficulty with closed chain R glute exercises secondary to knee pain from Osgood-Shlatter. No symptoms reported after session. Pt will benefit from continued skilled physical therapy services to decrease pain, improve strength and function.          PATIENT EDUCATION: Education details: there-ex, HEP Person educated: Patient Education method: Explanation, Demonstration, Tactile cues, Verbal cues, and Handouts Education comprehension: verbalized understanding and returned demonstration  HOME EXERCISE PROGRAM: Supine R piriformis stretch self, using contralateral foot              R 1 minute x 3 for 3  times daily.    Access Code: [email or web address] URL: [email or web address] Date: 09/16/2022 Prepared by: Loralyn Freshwater  Exercises - Quadruped Rockback Posterior Hip Capsule Stretch  - 3 x daily - 7 x weekly - 1 sets - 3 reps - 30 seconds hold  - Seated Flexion Stretch  - 3 x daily - 7 x weekly - 1 sets - 10 reps - 10 seconds hold      PT Short Term Goals - 09/14/22 1937       PT SHORT TERM GOAL #1   Title Pt will be independent with his initial HEP to decrease pain, improve strength, function, and ability to tolerate sitting and standing.    Baseline Pt has started his HEP (09/14/2022)    Time 3    Period Weeks    Status New    Target Date 10/08/22              PT Long Term Goals - 09/14/22 1938       PT LONG  TERM GOAL #1  Title Pt will have a decrease in R LE pain to 3/10 or less at worst to promote ability to tolerate sitting, standing, and walking.    Baseline 6/10 R LE pain at worst for the past 3 months (09/14/2022)    Time 8    Period Weeks    Status New    Target Date 11/12/22      PT LONG TERM GOAL #2   Title Pt will have a decrease in low back pain to 3/10 or less at worst to promote ability to sit, stand, walk more comfortably.    Baseline 7/10 low back pain at worst for the past 3 months (09/14/2022)    Time 8    Period Weeks    Status New    Target Date 11/12/22      PT LONG TERM GOAL #3   Title Pt will improve R hip IR PROM by at least 10 degrees to promote ability to stand, sit, ambulate more comfortably.    Baseline R hip PROM 12 degrees (09/14/2022)    Time 8    Period Weeks    Status New    Target Date 11/12/22      PT LONG TERM GOAL #4   Title Pt will improve R hip extension and abduction strength by at least 1/2 MMT grade to promote ability to sit, stand, and ambulate more comfortably.    Baseline R hip extension 4-/5, abduction 4/5 (09/14/2022)    Time 8    Period Weeks    Status New    Target Date 11/12/22      PT LONG TERM GOAL #5   Title Pt will improve his lumbar spine FOTO by at least 10 points as a demonstration of improved function.    Baseline Lumbar Spine FOTO 55 (09/14/2022)    Time 8    Period Weeks    Status New    Target Date 11/12/22              Plan - 09/16/22 1636     Clinical Impression Statement Worked on hip mobility to decrease pressure to sciatic nerve.  Decreased R anterior lateral thigh paresthesia with activation of glute max muscles in prone. Difficulty with closed chain R glute exercises secondary to knee pain from Osgood-Shlatter. No symptoms reported after session. Pt will benefit from continued skilled physical therapy services to decrease pain, improve strength and function.    Personal Factors and Comorbidities Comorbidity  3+;Time since onset of injury/illness/exacerbation;Profession;Fitness    Comorbidities Anxiety, depression, DM, HTN    Examination-Activity Limitations Stand;Sit;Locomotion Level    Stability/Clinical Decision Making Evolving/Moderate complexity   Pain has worsened since onset   Rehab Potential Fair    PT Frequency 2x / week    PT Duration 8 weeks    PT Treatment/Interventions Manual techniques;Patient/family education;Therapeutic exercise;Neuromuscular re-education;Therapeutic activities;Traction;Iontophoresis /ml Dexamethasone;Electrical Stimulation;Dry needling;Spinal Manipulations;Joint Manipulations    PT Next Visit Plan Posture, improve hip mobility, trunk and glute strengthening, manual techniques, modalities PRN    Consulted and Agree with Plan of Care Patient              Loralyn Freshwater PT, DPT  09/16/2022, 5:22 PM

## 2022-09-21 ENCOUNTER — Ambulatory Visit: Payer: 59

## 2022-10-11 LAB — HM DIABETES EYE EXAM

## 2022-10-26 ENCOUNTER — Ambulatory Visit: Payer: 59 | Attending: Family Medicine

## 2022-11-02 ENCOUNTER — Ambulatory Visit: Payer: 59

## 2022-11-12 ENCOUNTER — Other Ambulatory Visit: Payer: Self-pay | Admitting: Family Medicine

## 2022-11-12 DIAGNOSIS — I10 Essential (primary) hypertension: Secondary | ICD-10-CM

## 2022-11-12 NOTE — Telephone Encounter (Signed)
Requested Prescriptions  Pending Prescriptions Disp Refills   benazepril (LOTENSIN) 20 MG tablet [Pharmacy Med Name: BENAZEPRIL HCL 20MG  TABS] 90 tablet 0    Sig: TAKE ONE TABLET BY MOUTH EVERY DAY     Cardiovascular:  ACE Inhibitors Failed - 11/12/2022  1:15 PM      Failed - Last BP in normal range    BP Readings from Last 1 Encounters:  08/16/22 (!) 146/87         Passed - Cr in normal range and within 180 days    Creatinine, Ser  Date Value Ref Range Status  08/16/2022 0.84 0.76 - 1.27 mg/dL Final         Passed - K in normal range and within 180 days    Potassium  Date Value Ref Range Status  08/16/2022 4.1 3.5 - 5.2 mmol/L Final         Passed - Patient is not pregnant      Passed - Valid encounter within last 6 months    Recent Outpatient Visits           2 months ago Routine general medical examination at a health care facility   [facility], [place] P, DO   6 months ago Controlled type 2 diabetes mellitus with complication, without long-term current use of insulin (HCC)   [facility] [facility], Megan P, DO   9 months ago Controlled type 2 diabetes mellitus with complication, without long-term current use of insulin (HCC)   [facility] [place], Megan P, DO   1 year ago Controlled type 2 diabetes mellitus with complication, without long-term current use of insulin (HCC)   [facility] [place], Megan P, DO   1 year ago Routine general medical examination at a health care facility   [facility] Dorcas Carrow, DO       Future Appointments             In 4 days Dorcas Carrow, DO [place] [facility]   In 9 months Deirdre Evener, MD [facility]

## 2022-11-16 ENCOUNTER — Ambulatory Visit: Payer: 59 | Admitting: Family Medicine

## 2022-11-17 ENCOUNTER — Encounter: Payer: Self-pay | Admitting: Family Medicine

## 2022-11-17 ENCOUNTER — Ambulatory Visit (INDEPENDENT_AMBULATORY_CARE_PROVIDER_SITE_OTHER): Payer: 59 | Admitting: Family Medicine

## 2022-11-17 VITALS — BP 128/85 | HR 79 | Temp 97.8°F | Wt 271.2 lb

## 2022-11-17 DIAGNOSIS — F419 Anxiety disorder, unspecified: Secondary | ICD-10-CM | POA: Diagnosis not present

## 2022-11-17 DIAGNOSIS — F331 Major depressive disorder, recurrent, moderate: Secondary | ICD-10-CM

## 2022-11-17 DIAGNOSIS — I10 Essential (primary) hypertension: Secondary | ICD-10-CM

## 2022-11-17 DIAGNOSIS — E785 Hyperlipidemia, unspecified: Secondary | ICD-10-CM

## 2022-11-17 DIAGNOSIS — Z7985 Long-term (current) use of injectable non-insulin antidiabetic drugs: Secondary | ICD-10-CM

## 2022-11-17 DIAGNOSIS — E118 Type 2 diabetes mellitus with unspecified complications: Secondary | ICD-10-CM | POA: Diagnosis not present

## 2022-11-17 LAB — BAYER DCA HB A1C WAIVED: HB A1C (BAYER DCA - WAIVED): 6.1 % — ABNORMAL HIGH (ref 4.8–5.6)

## 2022-11-17 MED ORDER — ATORVASTATIN CALCIUM 10 MG PO TABS: 10.0000 mg | ORAL_TABLET | ORAL | 0 refills | Status: DC

## 2022-11-17 MED ORDER — DULOXETINE HCL 20 MG PO CPEP: 20.0000 mg | ORAL_CAPSULE | Freq: Every day | ORAL | 1 refills | Status: DC

## 2022-11-17 MED ORDER — CLONAZEPAM 1 MG PO TABS
ORAL_TABLET | ORAL | 0 refills | Status: DC
Start: 2022-11-17 — End: 2023-02-10

## 2022-11-17 NOTE — Assessment & Plan Note (Signed)
Does not want to continue taking his atorvastatin, so will cut to 10mg  weekly. Call with any concerns.

## 2022-11-17 NOTE — Assessment & Plan Note (Signed)
Under good control on current regimen. Will cut his cymbalta to 20mg. Continue to monitor. Call with any concerns. Refills given. Rx of klonopin should last this year.  

## 2022-11-17 NOTE — Assessment & Plan Note (Signed)
Under good control on current regimen. Will cut his cymbalta to 20mg . Continue to monitor. Call with any concerns. Refills given. Rx of klonopin should last this year.

## 2022-11-17 NOTE — Assessment & Plan Note (Signed)
Doing great with A1c of 6.1. Continue current regimen. Continue to monitor. Call with any concerns.  

## 2022-11-17 NOTE — Progress Notes (Signed)
BP 128/85   Pulse 79   Temp 97.8 F (36.6 C) (Oral)   Wt 271 lb 3.2 oz (123 kg)   SpO2 98%   BMI 37.93 kg/m    Subjective:    Patient ID: Aaron Robinson, male    DOB: [birth year], 52 y.o.   MRN: [id number]  HPI: Aaron Robinson is a 52 y.o. male  Chief Complaint  Patient presents with   Anxiety   Depression    Pt wants to discuss decreasing duloxetine    Diabetes    Requested eye exam.   Hypertension    Benazapril increased at last visit. Pt states its doing better possibly increase.   DIABETES Hypoglycemic episodes:no Polydipsia/polyuria: no Visual disturbance: no Chest pain: no Paresthesias: no Glucose Monitoring: no  Accucheck frequency: Not Checking Taking Insulin?: no Blood Pressure Monitoring: not checking Retinal Examination: Up to Date Foot Exam: Up to Date Diabetic Education: Completed Pneumovax: Up to Date Influenza: Up to Date Aspirin: no  HYPERTENSION / HYPERLIPIDEMIA Satisfied with current treatment? yes Duration of hypertension: chronic BP monitoring frequency: not checking BP medication side effects: no Past BP meds: atenolol, benazepril Duration of hyperlipidemia: chronic Cholesterol medication side effects: no Cholesterol supplements: none Past cholesterol medications: atorvastatin Medication compliance: excellent compliance Aspirin: no Recent stressors: no Recurrent headaches: no Visual changes: no Palpitations: no Dyspnea: no Chest pain: no Lower extremity edema: no Dizzy/lightheaded: no  ANXIETY/DEPRESSION- doing great with his mood, not needing his klonopin- taking maybe 1-2x a month Duration: chronic Status:controlled Anxious mood: no  Excessive worrying: no Irritability: no  Sweating: no Nausea: no Palpitations:no Hyperventilation: no Panic attacks: no Agoraphobia: no  Obscessions/compulsions: no Depressed mood: no    11/17/2022   10:42 AM 08/16/2022    4:12 PM 05/11/2022    8:18 AM 02/09/2022    8:30 AM  10/20/2021    8:33 AM  Depression screen PHQ 2/9  Decreased Interest 0 0 0 0 1  Down, Depressed, Hopeless 0 0 0 0 1  PHQ - 2 Score 0 0 0 0 2  Altered sleeping 1 0 0 0 0  Tired, decreased energy 1 1 0 0 1  Change in appetite 0 0 0 0 1  Feeling bad or failure about yourself  0 0 0 1 0  Trouble concentrating 0 0 0 0 1  Moving slowly or fidgety/restless 0 0 0 0 0  Suicidal thoughts 0 0 0 0 0  PHQ-9 Score 2 1 0 1 5  Difficult doing work/chores Not difficult at all Not difficult at all Not difficult at all  Not difficult at all      11/17/2022   10:42 AM 08/16/2022    4:13 PM 05/11/2022    8:18 AM 02/09/2022    8:30 AM  GAD 7 : Generalized Anxiety Score  Nervous, Anxious, on Edge 1 1 1 1   Control/stop worrying 1 1 1 2   Worry too much - different things 1 2 1 2   Trouble relaxing 0 0 0 0  Restless 0 0 0 0  Easily annoyed or irritable 0 0 0 1  Afraid - awful might happen 1 0 0 1  Total GAD 7 Score 4 4 3 7   Anxiety Difficulty Not difficult at all Not difficult at all Not difficult at all Somewhat difficult   Anhedonia: no Weight changes: no Insomnia: no   Hypersomnia: no Fatigue/loss of energy: no Feelings of worthlessness: no Feelings of guilt: no Impaired concentration/indecisiveness: no Suicidal ideations: no  Crying spells: no Recent Stressors/Life Changes: no   Relationship problems: no   Family stress: no     Financial stress: no    Job stress: no    Recent death/loss: no  Relevant past medical, surgical, family and social history reviewed and updated as indicated. Interim medical history since our last visit reviewed. Allergies and medications reviewed and updated.  Review of Systems  Constitutional: Negative.   Respiratory: Negative.    Cardiovascular: Negative.   Gastrointestinal: Negative.   Musculoskeletal: Negative.   Psychiatric/Behavioral: Negative.      Per HPI unless specifically indicated above     Objective:    BP 128/85   Pulse 79   Temp 97.8  F (36.6 C) (Oral)   Wt 271 lb 3.2 oz (123 kg)   SpO2 98%   BMI 37.93 kg/m   Wt Readings from Last 3 Encounters:  11/17/22 271 lb 3.2 oz (123 kg)  08/16/22 270 lb 14.4 oz (122.9 kg)  05/11/22 280 lb 1.6 oz (127.1 kg)    Physical Exam Vitals and nursing note reviewed.  Constitutional:      General: He is not in acute distress.    Appearance: Normal appearance. He is obese. He is not ill-appearing, toxic-appearing or diaphoretic.  HENT:     Head: Normocephalic and atraumatic.     Right Ear: External ear normal.     Left Ear: External ear normal.     Nose: Nose normal.     Mouth/Throat:     Mouth: Mucous membranes are moist.     Pharynx: Oropharynx is clear.  Eyes:     General: No scleral icterus.       Right eye: No discharge.        Left eye: No discharge.     Extraocular Movements: Extraocular movements intact.     Conjunctiva/sclera: Conjunctivae normal.     Pupils: Pupils are equal, round, and reactive to light.  Cardiovascular:     Rate and Rhythm: Normal rate and regular rhythm.     Pulses: Normal pulses.     Heart sounds: Normal heart sounds. No murmur heard.    No friction rub. No gallop.  Pulmonary:     Effort: Pulmonary effort is normal. No respiratory distress.     Breath sounds: Normal breath sounds. No stridor. No wheezing, rhonchi or rales.  Chest:     Chest wall: No tenderness.  Musculoskeletal:        General: Normal range of motion.     Cervical back: Normal range of motion and neck supple.  Skin:    General: Skin is warm and dry.     Capillary Refill: Capillary refill takes less than 2 seconds.     Coloration: Skin is not jaundiced or pale.     Findings: No bruising, erythema, lesion or rash.  Neurological:     General: No focal deficit present.     Mental Status: He is alert and oriented to person, place, and time. Mental status is at baseline.  Psychiatric:        Mood and Affect: Mood normal.        Behavior: Behavior normal.        Thought  Content: Thought content normal.        Judgment: Judgment normal.     Results for orders placed or performed in visit on 11/17/22  Bayer DCA Hb A1c Waived  Result Value Ref Range   HB A1C (BAYER DCA - WAIVED) 6.1 (  H) 4.8 - 5.6 %      Assessment & Plan:   Problem List Items Addressed This Visit       Cardiovascular and Mediastinum   Hypertension    Under good control on current regimen. Continue current regimen. Continue to monitor. Call with any concerns. Refills given.        Relevant Medications   atorvastatin (LIPITOR) 10 MG tablet     Endocrine   Controlled diabetes mellitus type 2 with complications (HCC) - Primary    Doing great with A1c of 6.1. Continue current regimen. Continue to monitor. Call with any concerns.       Relevant Medications   atorvastatin (LIPITOR) 10 MG tablet   Other Relevant Orders   Bayer DCA Hb A1c Waived (Completed)     Other   Anxiety    Under good control on current regimen. Will cut his cymbalta to 20mg . Continue to monitor. Call with any concerns. Refills given. Rx of klonopin should last this year.       Relevant Medications   DULoxetine (CYMBALTA) 20 MG capsule   clonazePAM (KLONOPIN) 1 MG tablet   Hyperlipidemia    Does not want to continue taking his atorvastatin, so will cut to 10mg  weekly. Call with any concerns.       Relevant Medications   atorvastatin (LIPITOR) 10 MG tablet   Major depressive disorder, recurrent episode, moderate (HCC)    Under good control on current regimen. Will cut his cymbalta to 20mg . Continue to monitor. Call with any concerns. Refills given. Rx of klonopin should last this year.        Relevant Medications   DULoxetine (CYMBALTA) 20 MG capsule     Follow up plan: Return in about 3 months (around 02/17/2023).

## 2022-11-17 NOTE — Assessment & Plan Note (Signed)
Under good control on current regimen. Continue current regimen. Continue to monitor. Call with any concerns. Refills given.   

## 2022-11-18 ENCOUNTER — Encounter: Payer: Self-pay | Admitting: Family Medicine

## 2023-01-31 ENCOUNTER — Encounter: Payer: Self-pay | Admitting: Family Medicine

## 2023-01-31 NOTE — Telephone Encounter (Signed)
Appt, virtual OK 

## 2023-01-31 NOTE — Telephone Encounter (Signed)
Called and scheduled patient on 02/10/2023 @ 10:40 am.

## 2023-02-10 ENCOUNTER — Telehealth (INDEPENDENT_AMBULATORY_CARE_PROVIDER_SITE_OTHER): Payer: 59 | Admitting: Family Medicine

## 2023-02-10 ENCOUNTER — Encounter: Payer: Self-pay | Admitting: Family Medicine

## 2023-02-10 DIAGNOSIS — K219 Gastro-esophageal reflux disease without esophagitis: Secondary | ICD-10-CM | POA: Diagnosis not present

## 2023-02-10 DIAGNOSIS — F419 Anxiety disorder, unspecified: Secondary | ICD-10-CM | POA: Diagnosis not present

## 2023-02-10 DIAGNOSIS — I10 Essential (primary) hypertension: Secondary | ICD-10-CM

## 2023-02-10 MED ORDER — ESOMEPRAZOLE MAGNESIUM 40 MG PO CPDR: DELAYED_RELEASE_CAPSULE | ORAL | 0 refills | Status: DC

## 2023-02-10 MED ORDER — CLONAZEPAM 1 MG PO TABS: ORAL_TABLET | ORAL | 0 refills | Status: DC

## 2023-02-10 MED ORDER — BENAZEPRIL HCL 20 MG PO TABS: 20.0000 mg | ORAL_TABLET | Freq: Every day | ORAL | 0 refills | Status: DC

## 2023-02-10 MED ORDER — DULOXETINE HCL 60 MG PO CPEP: 60.0000 mg | ORAL_CAPSULE | Freq: Every day | ORAL | 0 refills | Status: DC

## 2023-02-10 MED ORDER — ATENOLOL 100 MG PO TABS: 100.0000 mg | ORAL_TABLET | Freq: Every day | ORAL | 0 refills | Status: DC

## 2023-02-10 MED ORDER — OZEMPIC (0.25 OR 0.5 MG/DOSE) 2 MG/3ML ~~LOC~~ SOPN: 0.5000 mg | PEN_INJECTOR | SUBCUTANEOUS | 0 refills | Status: DC

## 2023-02-10 MED ORDER — BUSPIRONE HCL 10 MG PO TABS
10.0000 mg | ORAL_TABLET | Freq: Two times a day (BID) | ORAL | 3 refills | Status: DC
Start: 2023-02-10 — End: 2023-03-15

## 2023-02-10 NOTE — Progress Notes (Signed)
Called and scheduled patient appointment on 03/15/2023 @ 8:20 am.

## 2023-02-10 NOTE — Progress Notes (Signed)
There were no vitals taken for this visit.   Subjective:    Patient ID: Aaron Robinson, male    DOB: 10-23-1970, 52 y.o.   MRN: [id number]  HPI: Aaron Robinson is a 52 y.o. male  Chief Complaint  Patient presents with   Depression   Anxiety    Patient says his is starting to notice his Anxiety is starting to effect his every day work life. Patient says he would like to discuss possibly dose change or possible different treatment options for his anxiety.    ANXIETY/DEPRESSION Duration: chronic Status:exacerbated Anxious mood: yes  Excessive worrying: yes Irritability: yes  Sweating: no Nausea: no Palpitations:no Hyperventilation: no Panic attacks: no Agoraphobia: no  Obscessions/compulsions: no Depressed mood: yes    02/10/2023   10:38 AM 11/17/2022   10:42 AM 08/16/2022    4:12 PM 05/11/2022    8:18 AM 02/09/2022    8:30 AM  Depression screen PHQ 2/9  Decreased Interest 2 0 0 0 0  Down, Depressed, Hopeless 2 0 0 0 0  PHQ - 2 Score 4 0 0 0 0  Altered sleeping 2 1 0 0 0  Tired, decreased energy 2 1 1  0 0  Change in appetite 1 0 0 0 0  Feeling bad or failure about yourself  2 0 0 0 1  Trouble concentrating 2 0 0 0 0  Moving slowly or fidgety/restless 0 0 0 0 0  Suicidal thoughts 0 0 0 0 0  PHQ-9 Score 13 2 1  0 1  Difficult doing work/chores Very difficult Not difficult at all Not difficult at all Not difficult at all       02/10/2023   10:39 AM 11/17/2022   10:42 AM 08/16/2022    4:13 PM 05/11/2022    8:18 AM  GAD 7 : Generalized Anxiety Score  Nervous, Anxious, on Edge 3 1 1 1   Control/stop worrying 3 1 1 1   Worry too much - different things 3 1 2 1   Trouble relaxing 2 0 0 0  Restless 0 0 0 0  Easily annoyed or irritable 1 0 0 0  Afraid - awful might happen 2 1 0 0  Total GAD 7 Score 14 4 4 3   Anxiety Difficulty Very difficult Not difficult at all Not difficult at all Not difficult at all   Anhedonia: no Weight changes: no Insomnia: no    Hypersomnia: no Fatigue/loss of energy: yes Feelings of worthlessness: yes Feelings of guilt: yes Impaired concentration/indecisiveness: no Suicidal ideations: no  Crying spells: no Recent Stressors/Life Changes: yes   Relationship problems: no   Family stress: no     Financial stress: no    Job stress: no    Recent death/loss: no   Relevant past medical, surgical, family and social history reviewed and updated as indicated. Interim medical history since our last visit reviewed. Allergies and medications reviewed and updated.  Review of Systems  Constitutional: Negative.   Respiratory: Negative.    Cardiovascular: Negative.   Gastrointestinal: Negative.   Musculoskeletal: Negative.   Neurological: Negative.   Psychiatric/Behavioral: Negative.      Per HPI unless specifically indicated above     Objective:    There were no vitals taken for this visit.  Wt Readings from Last 3 Encounters:  11/17/22 271 lb 3.2 oz (123 kg)  08/16/22 270 lb 14.4 oz (122.9 kg)  05/11/22 280 lb 1.6 oz (127.1 kg)    Physical Exam Vitals and nursing note  reviewed.  Constitutional:      General: He is not in acute distress.    Appearance: Normal appearance. He is obese. He is not ill-appearing, toxic-appearing or diaphoretic.  HENT:     Head: Normocephalic and atraumatic.     Right Ear: External ear normal.     Left Ear: External ear normal.     Nose: Nose normal.     Mouth/Throat:     Mouth: Mucous membranes are moist.     Pharynx: Oropharynx is clear.  Eyes:     General: No scleral icterus.       Right eye: No discharge.        Left eye: No discharge.     Conjunctiva/sclera: Conjunctivae normal.     Pupils: Pupils are equal, round, and reactive to light.  Pulmonary:     Effort: Pulmonary effort is normal. No respiratory distress.     Comments: Speaking in full sentences Musculoskeletal:        General: Normal range of motion.     Cervical back: Normal range of motion.   Skin:    Coloration: Skin is not jaundiced or pale.     Findings: No bruising, erythema, lesion or rash.  Neurological:     Mental Status: He is alert and oriented to person, place, and time. Mental status is at baseline.  Psychiatric:        Mood and Affect: Mood normal.        Behavior: Behavior normal.        Thought Content: Thought content normal.        Judgment: Judgment normal.     Results for orders placed or performed in visit on 11/18/22  HM DIABETES EYE EXAM  Result Value Ref Range   HM Diabetic Eye Exam No Retinopathy No Retinopathy      Assessment & Plan:   Problem List Items Addressed This Visit       Cardiovascular and Mediastinum   Hypertension   Relevant Medications   atenolol (TENORMIN) 100 MG tablet   benazepril (LOTENSIN) 20 MG tablet     Digestive   GERD (gastroesophageal reflux disease)   Relevant Medications   esomeprazole (NEXIUM) 40 MG capsule     Other   Anxiety    Not doing well. Will increase his cymbalta to 60mg  and start buspar. Continue PRN klonopin. Refill given. Call with any concerns.       Relevant Medications   DULoxetine (CYMBALTA) 60 MG capsule   clonazePAM (KLONOPIN) 1 MG tablet   busPIRone (BUSPAR) 10 MG tablet     Follow up plan: Return in about 4 weeks (around 03/10/2023).    This visit was completed via video visit through MyChart due to the restrictions of the COVID-19 pandemic. All issues as above were discussed and addressed. Physical exam was done as above through visual confirmation on video through MyChart. If it was felt that the patient should be evaluated in the office, they were directed there. The patient verbally consented to this visit. Location of the patient: work Location of the provider: work Those involved with this call:  Provider: Olevia Perches, DO CMA: Malen Gauze, CMA Front Desk/Registration:  Servando Snare   Time spent on call:  15 minutes with patient face to face via video conference.  More than 50% of this time was spent in counseling and coordination of care. 23 minutes total spent in review of patient's record and preparation of their chart.

## 2023-02-10 NOTE — Assessment & Plan Note (Signed)
Not doing well. Will increase his cymbalta to 60mg  and start buspar. Continue PRN klonopin. Refill given. Call with any concerns.

## 2023-02-24 ENCOUNTER — Ambulatory Visit: Payer: 59 | Admitting: Family Medicine

## 2023-03-15 ENCOUNTER — Ambulatory Visit: Payer: 59 | Admitting: Family Medicine

## 2023-03-15 ENCOUNTER — Encounter: Payer: Self-pay | Admitting: Family Medicine

## 2023-03-15 VITALS — BP 146/84 | HR 81 | Ht 70.0 in | Wt 272.6 lb

## 2023-03-15 DIAGNOSIS — I10 Essential (primary) hypertension: Secondary | ICD-10-CM | POA: Diagnosis not present

## 2023-03-15 DIAGNOSIS — E785 Hyperlipidemia, unspecified: Secondary | ICD-10-CM

## 2023-03-15 DIAGNOSIS — K219 Gastro-esophageal reflux disease without esophagitis: Secondary | ICD-10-CM

## 2023-03-15 DIAGNOSIS — Z7985 Long-term (current) use of injectable non-insulin antidiabetic drugs: Secondary | ICD-10-CM

## 2023-03-15 DIAGNOSIS — F331 Major depressive disorder, recurrent, moderate: Secondary | ICD-10-CM

## 2023-03-15 DIAGNOSIS — E118 Type 2 diabetes mellitus with unspecified complications: Secondary | ICD-10-CM

## 2023-03-15 DIAGNOSIS — F419 Anxiety disorder, unspecified: Secondary | ICD-10-CM

## 2023-03-15 MED ORDER — SILDENAFIL CITRATE 100 MG PO TABS: 50.0000 mg | ORAL_TABLET | Freq: Every day | ORAL | 11 refills | Status: AC | PRN

## 2023-03-15 MED ORDER — OZEMPIC (0.25 OR 0.5 MG/DOSE) 2 MG/3ML ~~LOC~~ SOPN: 0.5000 mg | PEN_INJECTOR | SUBCUTANEOUS | 1 refills | Status: DC

## 2023-03-15 MED ORDER — BENAZEPRIL HCL 20 MG PO TABS: 30.0000 mg | ORAL_TABLET | Freq: Every day | ORAL | 1 refills | Status: DC

## 2023-03-15 MED ORDER — DULOXETINE HCL 60 MG PO CPEP: 60.0000 mg | ORAL_CAPSULE | Freq: Every day | ORAL | 1 refills | Status: DC

## 2023-03-15 MED ORDER — CLONAZEPAM 1 MG PO TABS: ORAL_TABLET | ORAL | 0 refills | Status: DC

## 2023-03-15 MED ORDER — BUSPIRONE HCL 10 MG PO TABS: 10.0000 mg | ORAL_TABLET | Freq: Two times a day (BID) | ORAL | 1 refills | Status: DC

## 2023-03-15 MED ORDER — ESOMEPRAZOLE MAGNESIUM 40 MG PO CPDR: DELAYED_RELEASE_CAPSULE | ORAL | 1 refills | Status: DC

## 2023-03-15 MED ORDER — ATENOLOL 100 MG PO TABS: 100.0000 mg | ORAL_TABLET | Freq: Every day | ORAL | 1 refills | Status: DC

## 2023-03-15 NOTE — Assessment & Plan Note (Signed)
Under good control on current regimen. Continue current regimen. Continue to monitor. Call with any concerns. Refills given. Labs drawn today.   

## 2023-03-15 NOTE — Assessment & Plan Note (Signed)
Rechecking labs today. Await results. Treat as needed.  °

## 2023-03-15 NOTE — Assessment & Plan Note (Signed)
Improved on his higher dose of cymbalta. Continue to monitor. Refills given. Klonopin should last 3-6 months.

## 2023-03-15 NOTE — Assessment & Plan Note (Signed)
Running a little high. Will increase his benazepril to 30mg  and recheck in 3 months. Call with any concerns.

## 2023-03-15 NOTE — Progress Notes (Signed)
BP (!) 146/84   Pulse 81   Ht 5\' 10"  (1.778 m)   Wt 272 lb 9.6 oz (123.7 kg)   SpO2 97%   BMI 39.11 kg/m    Subjective:    Patient ID: Aaron Robinson, male    DOB: [birth year], 52 y.o.   MRN: [id number]  HPI: Aaron Robinson is a 52 y.o. male  Chief Complaint  Patient presents with   Anxiety   Depression   ANXIETY/STRESS- feels like he needs the klonopin about 1x a week Duration: chronic Status:better Anxious mood: yes  Excessive worrying: yes Irritability: no  Sweating: no Nausea: no Palpitations:no Hyperventilation: no Panic attacks: no Agoraphobia: no  Obscessions/compulsions: no Depressed mood: yes    03/15/2023    8:28 AM 02/10/2023   10:38 AM 11/17/2022   10:42 AM 08/16/2022    4:12 PM 05/11/2022    8:18 AM  Depression screen PHQ 2/9  Decreased Interest 1 2 0 0 0  Down, Depressed, Hopeless 1 2 0 0 0  PHQ - 2 Score 2 4 0 0 0  Altered sleeping 0 2 1 0 0  Tired, decreased energy 1 2 1 1  0  Change in appetite 0 1 0 0 0  Feeling bad or failure about yourself  1 2 0 0 0  Trouble concentrating 0 2 0 0 0  Moving slowly or fidgety/restless 0 0 0 0 0  Suicidal thoughts 0 0 0 0 0  PHQ-9 Score 4 13 2 1  0  Difficult doing work/chores Somewhat difficult Very difficult Not difficult at all Not difficult at all Not difficult at all      03/15/2023    8:28 AM 02/10/2023   10:39 AM 11/17/2022   10:42 AM 08/16/2022    4:13 PM  GAD 7 : Generalized Anxiety Score  Nervous, Anxious, on Edge 2 3 1 1   Control/stop worrying 3 3 1 1   Worry too much - different things 2 3 1 2   Trouble relaxing 0 2 0 0  Restless 0 0 0 0  Easily annoyed or irritable 2 1 0 0  Afraid - awful might happen 1 2 1  0  Total GAD 7 Score 10 14 4 4   Anxiety Difficulty Somewhat difficult Very difficult Not difficult at all Not difficult at all   Anhedonia: no Weight changes: no Insomnia: no   Hypersomnia: no Fatigue/loss of energy: yes Feelings of worthlessness: no Feelings of guilt:  no Impaired concentration/indecisiveness: no Suicidal ideations: no  Crying spells: no Recent Stressors/Life Changes: yes   Relationship problems: no   Family stress: no     Financial stress: no    Job stress: no    Recent death/loss: no  HYPERTENSION / HYPERLIPIDEMIA Satisfied with current treatment? no Duration of hypertension: chronic BP monitoring frequency: rarely BP range: 140s-150s systolic BP medication side effects: no Past BP meds: benazepril, atenolol Duration of hyperlipidemia: chronic Cholesterol medication side effects: no Cholesterol supplements: none Past cholesterol medications: atoravastatin Medication compliance: excellent compliance Aspirin: no Recent stressors: yes Recurrent headaches: no Visual changes: no Palpitations: no Dyspnea: no Chest pain: no Lower extremity edema: no Dizzy/lightheaded: no  DIABETES Hypoglycemic episodes:no Polydipsia/polyuria: no Visual disturbance: no Chest pain: no Paresthesias: no Glucose Monitoring: no  Accucheck frequency: Not Checking Taking Insulin?: no Blood Pressure Monitoring: not checking Retinal Examination: Up to Date Foot Exam: Up to Date Diabetic Education: Completed Pneumovax: Up to Date Influenza: Not up to Date Aspirin: no  Relevant past medical, surgical,  family and social history reviewed and updated as indicated. Interim medical history since our last visit reviewed. Allergies and medications reviewed and updated.  Review of Systems  Constitutional: Negative.   Respiratory: Negative.    Cardiovascular: Negative.   Gastrointestinal: Negative.   Musculoskeletal: Negative.   Neurological: Negative.   Psychiatric/Behavioral: Negative.      Per HPI unless specifically indicated above     Objective:    BP (!) 146/84   Pulse 81   Ht 5\' 10"  (1.778 m)   Wt 272 lb 9.6 oz (123.7 kg)   SpO2 97%   BMI 39.11 kg/m   Wt Readings from Last 3 Encounters:  03/15/23 272 lb 9.6 oz (123.7 kg)   11/17/22 271 lb 3.2 oz (123 kg)  08/16/22 270 lb 14.4 oz (122.9 kg)    Physical Exam Vitals and nursing note reviewed.  Constitutional:      General: He is not in acute distress.    Appearance: Normal appearance. He is obese. He is not ill-appearing, toxic-appearing or diaphoretic.  HENT:     Head: Normocephalic and atraumatic.     Right Ear: External ear normal.     Left Ear: External ear normal.     Nose: Nose normal.     Mouth/Throat:     Mouth: Mucous membranes are moist.     Pharynx: Oropharynx is clear.  Eyes:     General: No scleral icterus.       Right eye: No discharge.        Left eye: No discharge.     Extraocular Movements: Extraocular movements intact.     Conjunctiva/sclera: Conjunctivae normal.     Pupils: Pupils are equal, round, and reactive to light.  Cardiovascular:     Rate and Rhythm: Normal rate and regular rhythm.     Pulses: Normal pulses.     Heart sounds: Normal heart sounds. No murmur heard.    No friction rub. No gallop.  Pulmonary:     Effort: Pulmonary effort is normal. No respiratory distress.     Breath sounds: Normal breath sounds. No stridor. No wheezing, rhonchi or rales.  Chest:     Chest wall: No tenderness.  Musculoskeletal:        General: Normal range of motion.     Cervical back: Normal range of motion and neck supple.  Skin:    General: Skin is warm and dry.     Capillary Refill: Capillary refill takes less than 2 seconds.     Coloration: Skin is not jaundiced or pale.     Findings: No bruising, erythema, lesion or rash.  Neurological:     General: No focal deficit present.     Mental Status: He is alert and oriented to person, place, and time. Mental status is at baseline.  Psychiatric:        Mood and Affect: Mood normal.        Behavior: Behavior normal.        Thought Content: Thought content normal.        Judgment: Judgment normal.     Results for orders placed or performed in visit on 11/18/22  HM DIABETES EYE  EXAM  Result Value Ref Range   HM Diabetic Eye Exam No Retinopathy No Retinopathy      Assessment & Plan:   Problem List Items Addressed This Visit       Cardiovascular and Mediastinum   Hypertension    Running a little high. Will increase  his benazepril to 30mg  and recheck in 3 months. Call with any concerns.       Relevant Medications   atenolol (TENORMIN) 100 MG tablet   benazepril (LOTENSIN) 20 MG tablet   sildenafil (VIAGRA) 100 MG tablet   Other Relevant Orders   Comprehensive metabolic panel   CBC with Differential/Platelet     Digestive   GERD (gastroesophageal reflux disease)    Under good control on current regimen. Continue current regimen. Continue to monitor. Call with any concerns. Refills given.  Labs drawn today.      Relevant Medications   esomeprazole (NEXIUM) 40 MG capsule   Other Relevant Orders   CBC with Differential/Platelet     Endocrine   Controlled diabetes mellitus type 2 with complications (HCC) - Primary    Rechecking labs today. Await results. Treat as needed.       Relevant Medications   benazepril (LOTENSIN) 20 MG tablet   Semaglutide,0.25 or 0.5MG /DOS, (OZEMPIC, 0.25 OR 0.5 MG/DOSE,) 2 MG/3ML SOPN   Other Relevant Orders   CBC with Differential/Platelet   Hgb A1c w/o eAG     Other   Anxiety    Improved on his higher dose of cymbalta. Continue to monitor. Refills given. Klonopin should last 3-6 months.       Relevant Medications   busPIRone (BUSPAR) 10 MG tablet   DULoxetine (CYMBALTA) 60 MG capsule   clonazePAM (KLONOPIN) 1 MG tablet   Other Relevant Orders   CBC with Differential/Platelet   Hyperlipidemia    Under good control on current regimen. Continue current regimen. Continue to monitor. Call with any concerns. Refills given.  Labs drawn today.      Relevant Medications   atenolol (TENORMIN) 100 MG tablet   benazepril (LOTENSIN) 20 MG tablet   sildenafil (VIAGRA) 100 MG tablet   Other Relevant Orders   Lipid  Panel w/o Chol/HDL Ratio   Major depressive disorder, recurrent episode, moderate (HCC)    Under good control on current regimen. Continue current regimen. Continue to monitor. Call with any concerns. Refills given.  Labs drawn today.      Relevant Medications   busPIRone (BUSPAR) 10 MG tablet   DULoxetine (CYMBALTA) 60 MG capsule     Follow up plan: Return in about 3 months (around 06/15/2023).

## 2023-03-16 LAB — COMPREHENSIVE METABOLIC PANEL
ALT: 26 [IU]/L (ref 0–44)
AST: 21 [IU]/L (ref 0–40)
Albumin: 4.3 g/dL (ref 3.8–4.9)
Alkaline Phosphatase: 80 [IU]/L (ref 44–121)
BUN/Creatinine Ratio: 11 (ref 9–20)
BUN: 8 mg/dL (ref 6–24)
Bilirubin Total: 0.3 mg/dL (ref 0.0–1.2)
CO2: 20 mmol/L (ref 20–29)
Calcium: 9 mg/dL (ref 8.7–10.2)
Chloride: 100 mmol/L (ref 96–106)
Creatinine, Ser: 0.76 mg/dL (ref 0.76–1.27)
Globulin, Total: 3 g/dL (ref 1.5–4.5)
Glucose: 125 mg/dL — ABNORMAL HIGH (ref 70–99)
Potassium: 3.9 mmol/L (ref 3.5–5.2)
Sodium: 135 mmol/L (ref 134–144)
Total Protein: 7.3 g/dL (ref 6.0–8.5)
eGFR: 108 mL/min/{1.73_m2} (ref >59)

## 2023-03-16 LAB — CBC WITH DIFFERENTIAL/PLATELET
Basophils Absolute: 0 10*3/uL (ref 0.0–0.2)
Basos: 1 %
EOS (ABSOLUTE): 0.2 10*3/uL (ref 0.0–0.4)
Eos: 4 %
Hematocrit: 46.7 % (ref 37.5–51.0)
Hemoglobin: 15 g/dL (ref 13.0–17.7)
Immature Grans (Abs): 0 10*3/uL (ref 0.0–0.1)
Immature Granulocytes: 0 %
Lymphocytes Absolute: 1.2 10*3/uL (ref 0.7–3.1)
Lymphs: 23 %
MCH: 26.3 pg — ABNORMAL LOW (ref 26.6–33.0)
MCHC: 32.1 g/dL (ref 31.5–35.7)
MCV: 82 fL (ref 79–97)
Monocytes Absolute: 0.4 10*3/uL (ref 0.1–0.9)
Monocytes: 8 %
Neutrophils Absolute: 3.4 10*3/uL (ref 1.4–7.0)
Neutrophils: 64 %
Platelets: 252 10*3/uL (ref 150–450)
RBC: 5.71 x10E6/uL (ref 4.14–5.80)
RDW: 13.4 % (ref 11.6–15.4)
WBC: 5.2 10*3/uL (ref 3.4–10.8)

## 2023-03-16 LAB — LIPID PANEL W/O CHOL/HDL RATIO
Cholesterol, Total: 155 mg/dL (ref 100–199)
HDL: 37 mg/dL — ABNORMAL LOW (ref >39)
LDL Chol Calc (NIH): 91 mg/dL (ref 0–99)
Triglycerides: 152 mg/dL — ABNORMAL HIGH (ref 0–149)
VLDL Cholesterol Cal: 27 mg/dL (ref 5–40)

## 2023-03-16 LAB — HGB A1C W/O EAG: Hgb A1c MFr Bld: 6.2 % — ABNORMAL HIGH (ref 4.8–5.6)

## 2023-06-15 ENCOUNTER — Ambulatory Visit: Payer: 59 | Admitting: Family Medicine

## 2023-06-24 ENCOUNTER — Ambulatory Visit (INDEPENDENT_AMBULATORY_CARE_PROVIDER_SITE_OTHER): Payer: 59 | Admitting: Family Medicine

## 2023-06-24 ENCOUNTER — Encounter: Payer: Self-pay | Admitting: Family Medicine

## 2023-06-24 VITALS — BP 128/83 | HR 83 | Ht 70.0 in | Wt 274.2 lb

## 2023-06-24 DIAGNOSIS — E118 Type 2 diabetes mellitus with unspecified complications: Secondary | ICD-10-CM

## 2023-06-24 DIAGNOSIS — I10 Essential (primary) hypertension: Secondary | ICD-10-CM

## 2023-06-24 DIAGNOSIS — F419 Anxiety disorder, unspecified: Secondary | ICD-10-CM

## 2023-06-24 DIAGNOSIS — F331 Major depressive disorder, recurrent, moderate: Secondary | ICD-10-CM

## 2023-06-24 LAB — BAYER DCA HB A1C WAIVED: HB A1C (BAYER DCA - WAIVED): 5.8 % — ABNORMAL HIGH (ref 4.8–5.6)

## 2023-06-24 MED ORDER — CLONAZEPAM 1 MG PO TABS: ORAL_TABLET | ORAL | 0 refills | Status: DC

## 2023-06-24 MED ORDER — BENAZEPRIL HCL 40 MG PO TABS: 40.0000 mg | ORAL_TABLET | Freq: Every day | ORAL | 1 refills | Status: DC

## 2023-06-24 NOTE — Assessment & Plan Note (Signed)
Doing well with A1c of 5.8! Continue current regimen. Call with any concerns. Continue to monitor.

## 2023-06-24 NOTE — Assessment & Plan Note (Signed)
Under good control on current regimen. Continue current regimen. Continue to monitor. Call with any concerns. Refills given. Follow up 3 months.  

## 2023-06-24 NOTE — Assessment & Plan Note (Signed)
Increased his benazepril to 40mg . Doing well. Will continue and check labs in 3 months. Refills given.

## 2023-06-24 NOTE — Progress Notes (Signed)
BP 128/83   Pulse 83   Ht 5\' 10"  (1.778 m)   Wt 274 lb 3.2 oz (124.4 kg)   SpO2 98%   BMI 39.34 kg/m    Subjective:    Patient ID: Aaron Robinson, male    DOB: 01-Dec-1970, 53 y.o.   MRN: [id number]  HPI: Aaron Robinson is a 53 y.o. male  Chief Complaint  Patient presents with   Diabetes   DIABETES Hypoglycemic episodes:no Polydipsia/polyuria: no Visual disturbance: no Chest pain: no Paresthesias: no Glucose Monitoring: no Taking Insulin?: no Blood Pressure Monitoring: not checking Retinal Examination: Up to Date Foot Exam: Up to Date Diabetic Education: Completed Pneumovax: Not up to Date Influenza: Not up to Date Aspirin: no  ANXIETY/STRESS- had 2 panic attacks this week out of the blue, generally had been doing better Duration: chronic Status better, but then worse Anxious mood: yes  Excessive worrying: yes Irritability: no  Sweating: no Nausea: no Palpitations:no Hyperventilation: no Panic attacks: yes Agoraphobia: no  Obscessions/compulsions: no Depressed mood: no    03/15/2023    8:28 AM 02/10/2023   10:38 AM 11/17/2022   10:42 AM 08/16/2022    4:12 PM 05/11/2022    8:18 AM  Depression screen PHQ 2/9  Decreased Interest 1 2 0 0 0  Down, Depressed, Hopeless 1 2 0 0 0  PHQ - 2 Score 2 4 0 0 0  Altered sleeping 0 2 1 0 0  Tired, decreased energy 1 2 1 1  0  Change in appetite 0 1 0 0 0  Feeling bad or failure about yourself  1 2 0 0 0  Trouble concentrating 0 2 0 0 0  Moving slowly or fidgety/restless 0 0 0 0 0  Suicidal thoughts 0 0 0 0 0  PHQ-9 Score 4 13 2 1  0  Difficult doing work/chores Somewhat difficult Very difficult Not difficult at all Not difficult at all Not difficult at all      03/15/2023    8:28 AM 02/10/2023   10:39 AM 11/17/2022   10:42 AM 08/16/2022    4:13 PM  GAD 7 : Generalized Anxiety Score  Nervous, Anxious, on Edge 2 3 1 1   Control/stop worrying 3 3 1 1   Worry too much - different things 2 3 1 2   Trouble  relaxing 0 2 0 0  Restless 0 0 0 0  Easily annoyed or irritable 2 1 0 0  Afraid - awful might happen 1 2 1  0  Total GAD 7 Score 10 14 4 4   Anxiety Difficulty Somewhat difficult Very difficult Not difficult at all Not difficult at all   Anhedonia: no Weight changes: no Insomnia: no   Hypersomnia: no Fatigue/loss of energy: no Feelings of worthlessness: no Feelings of guilt: no Impaired concentration/indecisiveness: no Suicidal ideations: no  Crying spells: no Recent Stressors/Life Changes: no   Relationship problems: no   Family stress: no     Financial stress: no    Job stress: no    Recent death/loss: no  HYPERTENSION  Hypertension status: was worse, increased his benazepril up to 40mg  on his own a few days ago  Satisfied with current treatment? yes Duration of hypertension: chronic BP monitoring frequency:  a few times a week BP medication side effects:  no Medication compliance: good compliance Previous BP meds:benazepril, atenolol Aspirin: no Recurrent headaches: no Visual changes: no Palpitations: no Dyspnea: no Chest pain: no Lower extremity edema: no Dizzy/lightheaded: no   Relevant past medical,  surgical, family and social history reviewed and updated as indicated. Interim medical history since our last visit reviewed. Allergies and medications reviewed and updated.  Review of Systems  Constitutional: Negative.   Respiratory: Negative.    Cardiovascular: Negative.   Musculoskeletal: Negative.   Psychiatric/Behavioral: Negative.      Per HPI unless specifically indicated above     Objective:    BP 128/83   Pulse 83   Ht 5\' 10"  (1.778 m)   Wt 274 lb 3.2 oz (124.4 kg)   SpO2 98%   BMI 39.34 kg/m   Wt Readings from Last 3 Encounters:  06/24/23 274 lb 3.2 oz (124.4 kg)  03/15/23 272 lb 9.6 oz (123.7 kg)  11/17/22 271 lb 3.2 oz (123 kg)    Physical Exam Vitals and nursing note reviewed.  Constitutional:      General: He is not in acute  distress.    Appearance: Normal appearance. He is not ill-appearing, toxic-appearing or diaphoretic.  HENT:     Head: Normocephalic and atraumatic.     Right Ear: External ear normal.     Left Ear: External ear normal.     Nose: Nose normal.     Mouth/Throat:     Mouth: Mucous membranes are moist.     Pharynx: Oropharynx is clear.  Eyes:     General: No scleral icterus.       Right eye: No discharge.        Left eye: No discharge.     Extraocular Movements: Extraocular movements intact.     Conjunctiva/sclera: Conjunctivae normal.     Pupils: Pupils are equal, round, and reactive to light.  Cardiovascular:     Rate and Rhythm: Normal rate and regular rhythm.     Pulses: Normal pulses.     Heart sounds: Normal heart sounds. No murmur heard.    No friction rub. No gallop.  Pulmonary:     Effort: Pulmonary effort is normal. No respiratory distress.     Breath sounds: Normal breath sounds. No stridor. No wheezing, rhonchi or rales.  Chest:     Chest wall: No tenderness.  Musculoskeletal:        General: Normal range of motion.     Cervical back: Normal range of motion and neck supple.  Skin:    General: Skin is warm and dry.     Capillary Refill: Capillary refill takes less than 2 seconds.     Coloration: Skin is not jaundiced or pale.     Findings: No bruising, erythema, lesion or rash.  Neurological:     General: No focal deficit present.     Mental Status: He is alert and oriented to person, place, and time. Mental status is at baseline.  Psychiatric:        Mood and Affect: Mood normal.        Behavior: Behavior normal.        Thought Content: Thought content normal.        Judgment: Judgment normal.     Results for orders placed or performed in visit on 03/15/23  Comprehensive metabolic panel   Collection Time: 03/15/23  8:48 AM  Result Value Ref Range   Glucose 125 (H) 70 - 99 mg/dL   BUN 8 6 - 24 mg/dL   Creatinine, Ser 4.09 0.76 - 1.27 mg/dL   eGFR 811 >91  YN/WGN/5.62   BUN/Creatinine Ratio 11 9 - 20   Sodium 135 134 - 144 mmol/L   Potassium  3.9 3.5 - 5.2 mmol/L   Chloride 100 96 - 106 mmol/L   CO2 20 20 - 29 mmol/L   Calcium 9.0 8.7 - 10.2 mg/dL   Total Protein 7.3 6.0 - 8.5 g/dL   Albumin 4.3 3.8 - 4.9 g/dL   Globulin, Total 3.0 1.5 - 4.5 g/dL   Bilirubin Total 0.3 0.0 - 1.2 mg/dL   Alkaline Phosphatase 80 44 - 121 IU/L   AST 21 0 - 40 IU/L   ALT 26 0 - 44 IU/L  CBC with Differential/Platelet   Collection Time: 03/15/23  8:48 AM  Result Value Ref Range   WBC 5.2 3.4 - 10.8 x10E3/uL   RBC 5.71 4.14 - 5.80 x10E6/uL   Hemoglobin 15.0 13.0 - 17.7 g/dL   Hematocrit 54.0 98.1 - 51.0 %   MCV 82 79 - 97 fL   MCH 26.3 (L) 26.6 - 33.0 pg   MCHC 32.1 31.5 - 35.7 g/dL   RDW 19.1 47.8 - 29.5 %   Platelets 252 150 - 450 x10E3/uL   Neutrophils 64 Not Estab. %   Lymphs 23 Not Estab. %   Monocytes 8 Not Estab. %   Eos 4 Not Estab. %   Basos 1 Not Estab. %   Neutrophils Absolute 3.4 1.4 - 7.0 x10E3/uL   Lymphocytes Absolute 1.2 0.7 - 3.1 x10E3/uL   Monocytes Absolute 0.4 0.1 - 0.9 x10E3/uL   EOS (ABSOLUTE) 0.2 0.0 - 0.4 x10E3/uL   Basophils Absolute 0.0 0.0 - 0.2 x10E3/uL   Immature Granulocytes 0 Not Estab. %   Immature Grans (Abs) 0.0 0.0 - 0.1 x10E3/uL  Hgb A1c w/o eAG   Collection Time: 03/15/23  8:48 AM  Result Value Ref Range   Hgb A1c MFr Bld 6.2 (H) 4.8 - 5.6 %  Lipid Panel w/o Chol/HDL Ratio   Collection Time: 03/15/23  8:48 AM  Result Value Ref Range   Cholesterol, Total 155 100 - 199 mg/dL   Triglycerides 621 (H) 0 - 149 mg/dL   HDL 37 (L) >30 mg/dL   VLDL Cholesterol Cal 27 5 - 40 mg/dL   LDL Chol Calc (NIH) 91 0 - 99 mg/dL      Assessment & Plan:   Problem List Items Addressed This Visit       Cardiovascular and Mediastinum   Hypertension   Increased his benazepril to 40mg . Doing well. Will continue and check labs in 3 months. Refills given.       Relevant Medications   benazepril (LOTENSIN) 40 MG tablet      Endocrine   Controlled diabetes mellitus type 2 with complications (HCC) - Primary   Doing well with A1c of 5.8! Continue current regimen. Call with any concerns. Continue to monitor.       Relevant Medications   benazepril (LOTENSIN) 40 MG tablet   Other Relevant Orders   Bayer DCA Hb A1c Waived     Other   Anxiety   Under good control on current regimen. Continue current regimen. Continue to monitor. Call with any concerns. Refills given. Follow up 3 months.       Relevant Medications   clonazePAM (KLONOPIN) 1 MG tablet   Major depressive disorder, recurrent episode, moderate (HCC)   Under good control on current regimen. Continue current regimen. Continue to monitor. Call with any concerns. Refills up to date.          Follow up plan: Return in about 3 months (around 09/21/2023) for physical.

## 2023-06-24 NOTE — Assessment & Plan Note (Signed)
Under good control on current regimen. Continue current regimen. Continue to monitor. Call with any concerns. Refills up to date.   

## 2023-08-18 ENCOUNTER — Ambulatory Visit (INDEPENDENT_AMBULATORY_CARE_PROVIDER_SITE_OTHER): Admitting: Dermatology

## 2023-08-18 ENCOUNTER — Ambulatory Visit: Payer: 59 | Admitting: Dermatology

## 2023-08-18 DIAGNOSIS — L738 Other specified follicular disorders: Secondary | ICD-10-CM

## 2023-08-18 DIAGNOSIS — D2239 Melanocytic nevi of other parts of face: Secondary | ICD-10-CM

## 2023-08-18 DIAGNOSIS — L578 Other skin changes due to chronic exposure to nonionizing radiation: Secondary | ICD-10-CM

## 2023-08-18 DIAGNOSIS — Z1283 Encounter for screening for malignant neoplasm of skin: Secondary | ICD-10-CM

## 2023-08-18 DIAGNOSIS — D1801 Hemangioma of skin and subcutaneous tissue: Secondary | ICD-10-CM

## 2023-08-18 DIAGNOSIS — L918 Other hypertrophic disorders of the skin: Secondary | ICD-10-CM

## 2023-08-18 DIAGNOSIS — D229 Melanocytic nevi, unspecified: Secondary | ICD-10-CM

## 2023-08-18 DIAGNOSIS — W908XXA Exposure to other nonionizing radiation, initial encounter: Secondary | ICD-10-CM | POA: Diagnosis not present

## 2023-08-18 DIAGNOSIS — D2262 Melanocytic nevi of left upper limb, including shoulder: Secondary | ICD-10-CM

## 2023-08-18 DIAGNOSIS — L821 Other seborrheic keratosis: Secondary | ICD-10-CM

## 2023-08-18 DIAGNOSIS — L814 Other melanin hyperpigmentation: Secondary | ICD-10-CM

## 2023-08-18 DIAGNOSIS — L858 Other specified epidermal thickening: Secondary | ICD-10-CM

## 2023-08-18 DIAGNOSIS — L853 Xerosis cutis: Secondary | ICD-10-CM

## 2023-08-18 NOTE — Progress Notes (Unsigned)
 Follow-Up Visit   Subjective  Aaron Robinson is a 53 y.o. male who presents for the following: Skin Cancer Screening and Full Body Skin Exam  The patient presents for Total-Body Skin Exam (TBSE) for skin cancer screening and mole check. The patient has spots, moles and lesions to be evaluated, some may be new or changing and the patient may have concern these could be cancer.  The following portions of the chart were reviewed this encounter and updated as appropriate: medications, allergies, medical history  Review of Systems:  No other skin or systemic complaints except as noted in HPI or Assessment and Plan.  Objective  Well appearing patient in no apparent distress; mood and affect are within normal limits.  A full examination was performed including scalp, head, eyes, ears, nose, lips, neck, chest, axillae, abdomen, back, buttocks, bilateral upper extremities, bilateral lower extremities, hands, feet, fingers, toes, fingernails, and toenails. All findings within normal limits unless otherwise noted below.   Relevant physical exam findings are noted in the Assessment and Plan.   Assessment & Plan   SKIN CANCER SCREENING PERFORMED TODAY.  ACTINIC DAMAGE - Chronic condition, secondary to cumulative UV/sun exposure - diffuse scaly erythematous macules with underlying dyspigmentation - Recommend daily broad spectrum sunscreen SPF 30+ to sun-exposed areas, reapply every 2 hours as needed.  - Staying in the shade or wearing long sleeves, sun glasses (UVA+UVB protection) and wide brim hats (4-inch brim around the entire circumference of the hat) are also recommended for sun protection.  - Call for new or changing lesions.  LENTIGINES, SEBORRHEIC KERATOSES, HEMANGIOMAS - Benign normal skin lesions - Benign-appearing - Call for any changes  MELANOCYTIC NEVI - L wrist 0.6 cm flesh thin papule - L perioral flesh papule - Tan-brown and/or pink-flesh-colored symmetric macules and  papules - Benign appearing on exam today - Observation - Call clinic for new or changing moles - Recommend daily use of broad spectrum spf 30+ sunscreen to sun-exposed areas.   Acrochordons (Skin Tags) - Fleshy, skin-colored pedunculated papules - Benign appearing.  - Observe. - If desired, they can be removed with an in office procedure that is not covered by insurance. - Please call the clinic if you notice any new or changing lesions.  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 No cure. - Observe. - If desired, patient can use an emollient (moisturizer) containing ammonium lactate (AmLactin), urea or salicylic acid once a day to smooth the area  Recommend starting moisturizer with exfoliant (Urea, Salicylic acid, or Lactic acid) one to two times daily to help smooth rough and bumpy skin.  OTC options include Cetaphil Rough and Bumpy lotion (Urea), Eucerin Roughness Relief lotion or spot treatment cream (Urea), CeraVe SA lotion/cream for Rough and Bumpy skin (Sal Acid), Gold Bond Rough and Bumpy cream (Sal Acid), and AmLactin 12% lotion/cream (Lactic Acid).  If applying in morning, also apply sunscreen to sun-exposed areas, since these exfoliating moisturizers can increase sensitivity to sun.   Xerosis - diffuse xerotic patches on elbows, ankles, legs - recommend gentle, hydrating skin care - gentle skin care handout given  Sebaceous Hyperplasia - Small yellow papules with a central dell R paranasal - Benign-appearing - Observe. Call for changes.  Return in about 1 year (around 08/17/2024) for TBSE.  Maylene Roes, CMA, am acting as scribe for Willeen Niece, MD .   Documentation: I have reviewed the above documentation for accuracy and completeness, and  I agree with the above.  Willeen Niece, MD

## 2023-08-18 NOTE — Patient Instructions (Addendum)
 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 Genetic in nature. No cure. - Observe. - If desired, patient can use an emollient (moisturizer) containing ammonium lactate (AmLactin), urea or salicylic acid once a day to smooth the area  Recommend starting moisturizer with exfoliant (Urea, Salicylic acid, or Lactic acid) one to two times daily to help smooth rough and bumpy skin.  OTC options include Cetaphil Rough and Bumpy lotion (Urea), Eucerin Roughness Relief lotion or spot treatment cream (Urea), CeraVe SA lotion/cream for Rough and Bumpy skin (Sal Acid), Gold Bond Rough and Bumpy cream (Sal Acid), and AmLactin 12% lotion/cream (Lactic Acid).  If applying in morning, also apply sunscreen to sun-exposed areas, since these exfoliating moisturizers can increase sensitivity to sun.   Recommend starting moisturizer with exfoliant (Urea, Salicylic acid, or Lactic acid) one to two times daily to help smooth rough and bumpy skin.  OTC options include Cetaphil Rough and Bumpy lotion (Urea), Eucerin Roughness Relief lotion or spot treatment cream (Urea), CeraVe SA lotion/cream for Rough and Bumpy skin (Sal Acid), Gold Bond Rough and Bumpy cream (Sal Acid), and AmLactin 12% lotion/cream (Lactic Acid).  If applying in morning, also apply sunscreen to sun-exposed areas, since these exfoliating moisturizers can increase sensitivity to sun.  Due to recent changes in healthcare laws, you may see results of your pathology and/or laboratory studies on MyChart before the doctors have had a chance to review them. We understand that in some cases there may be results that are confusing or concerning to you. Please understand that not all results are received at the same time and often the doctors may need to interpret multiple results in order to provide you with the best plan of care or course of treatment. Therefore, we ask that you please give [place] 2 business days to thoroughly review all  your results before contacting the office for clarification. Should we see a critical lab result, you will be contacted sooner.   If You Need Anything After Your Visit  If you have any questions or concerns for your doctor, please call our main line at [phone] and press option 4 to reach your doctor's medical assistant. If no one answers, please leave a voicemail as directed and we will return your call as soon as possible. Messages left after 4 pm will be answered the following business day.   You may also send [place] a message via MyChart. We typically respond to MyChart messages within 1-2 business days.  For prescription refills, please ask your pharmacy to contact our office. Our fax number is [phone].  If you have an urgent issue when the clinic is closed that cannot wait until the next business day, you can page your doctor at the number below.    Please note that while we do our best to be available for urgent issues outside of office hours, we are not available 24/7.   If you have an urgent issue and are unable to reach [place], you may choose to seek medical care at your doctor's office, retail clinic, urgent care center, or emergency room.  If you have a medical emergency, please immediately call 911 or go to the emergency department.  Pager Numbers  - Dr. Gwen Pounds: [phone]  - Dr. Roseanne Reno: [phone]  - Dr. Katrinka Blazing: [phone]   In the event of inclement weather, please call our main line at [phone] for an update on the status of any delays or closures.  Dermatology Medication Tips: Please keep  the boxes that topical medications come in in order to help keep track of the instructions about where and how to use these. Pharmacies typically print the medication instructions only on the boxes and not directly on the medication tubes.   If your medication is too expensive, please contact our office at [phone] option 4 or send [place] a message through MyChart.    We are unable to tell what your co-pay for medications will be in advance as this is different depending on your insurance coverage. However, we may be able to find a substitute medication at lower cost or fill out paperwork to get insurance to cover a needed medication.   If a prior authorization is required to get your medication covered by your insurance company, please allow [place] 1-2 business days to complete this process.  Drug prices often vary depending on where the prescription is filled and some pharmacies may offer cheaper prices.  The website www.goodrx.com contains coupons for medications through different pharmacies. The prices here do not account for what the cost may be with help from insurance (it may be cheaper with your insurance), but the website can give you the price if you did not use any insurance.  - You can print the associated coupon and take it with your prescription to the pharmacy.  - You may also stop by our office during regular business hours and pick up a GoodRx coupon card.  - If you need your prescription sent electronically to a different pharmacy, notify our office through [facility] or by phone at [phone] option 4.     Si Usted Necesita Algo Despus de Su Visita  Tambin puede enviarnos un mensaje a travs de Clinical cytogeneticist. Por lo general respondemos a los mensajes de MyChart en el transcurso de 1 a 2 das hbiles.  Para renovar recetas, por favor pida a su farmacia que se ponga en contacto con nuestra oficina. Annie Sable de fax es [place] [phone].  Si tiene un asunto urgente cuando la clnica est cerrada y que no puede esperar hasta el siguiente da hbil, puede llamar/localizar a su doctor(a) al nmero que aparece a continuacin.   Por favor, tenga en cuenta que aunque hacemos todo lo posible para estar disponibles para asuntos urgentes fuera del horario de [place], no estamos disponibles las 24 horas del da, los 7 [address]  [address] de la [place].    Si tiene un problema urgente y no puede comunicarse con nosotros, puede optar por buscar atencin mdica  en el consultorio de su doctor(a), en una clnica privada, en un centro de atencin urgente o en una sala de emergencias.  Si tiene Engineer, drilling, por favor llame inmediatamente al 911 o vaya a la sala de emergencias.  Nmeros de bper  - Dr. Gwen Pounds: [phone]  - Dra. Roseanne Reno: [phone]  - Dr. Katrinka Blazing: [phone]   En caso de inclemencias del tiempo, por favor llame a Lacy Duverney principal al [phone] para una actualizacin sobre el [place] de cualquier retraso o cierre.  Consejos para la medicacin en dermatologa: Por favor, guarde las cajas en las que vienen los medicamentos de uso tpico para ayudarle a seguir las instrucciones sobre dnde y cmo usarlos. Las farmacias generalmente imprimen las instrucciones del medicamento slo en las cajas y no directamente en los tubos del [place].   Si su medicamento es muy caro, por favor, pngase en contacto con Rolm Gala llamando al [phone] y presione la opcin 4 o envenos un mensaje a travs de  MyChart.   No podemos decirle cul ser su copago por los medicamentos por adelantado ya que esto es diferente dependiendo de la cobertura de su seguro. Sin embargo, es posible que podamos encontrar un medicamento sustituto a Audiological scientist un formulario para que el seguro cubra el medicamento que se considera necesario.   Si se requiere una autorizacin previa para que su compaa de seguros [place] su medicamento, por favor permtanos de 1 a 2 das hbiles para completar [address]  Los precios de los medicamentos varan con frecuencia dependiendo del Environmental consultant de dnde se surte la receta y alguna farmacias pueden ofrecer precios ms baratos.  El sitio web www.goodrx.com tiene cupones para medicamentos de Health and safety inspector. Los precios aqu no tienen en cuenta lo que podra costar con la ayuda del seguro  (puede ser ms barato con su seguro), pero el sitio web puede darle el precio si no utiliz Tourist information centre manager.  - Puede imprimir el cupn correspondiente y llevarlo con su receta a la farmacia.  - Tambin puede pasar por nuestra oficina durante el horario de atencin regular y Education officer, museum una tarjeta de cupones de GoodRx.  - Si necesita que su receta se enve electrnicamente a una farmacia diferente, informe a nuestra oficina a travs de MyChart de [place] o por telfono llamando al [phone] y presione la opcin 4.

## 2023-10-05 ENCOUNTER — Ambulatory Visit (INDEPENDENT_AMBULATORY_CARE_PROVIDER_SITE_OTHER): Payer: 59 | Admitting: Family Medicine

## 2023-10-05 ENCOUNTER — Encounter: Payer: Self-pay | Admitting: Family Medicine

## 2023-10-05 VITALS — BP 123/82 | HR 76 | Ht 70.0 in | Wt 270.8 lb

## 2023-10-05 DIAGNOSIS — Z0000 Encounter for general adult medical examination without abnormal findings: Secondary | ICD-10-CM

## 2023-10-05 DIAGNOSIS — E785 Hyperlipidemia, unspecified: Secondary | ICD-10-CM | POA: Diagnosis not present

## 2023-10-05 DIAGNOSIS — F331 Major depressive disorder, recurrent, moderate: Secondary | ICD-10-CM | POA: Diagnosis not present

## 2023-10-05 DIAGNOSIS — E118 Type 2 diabetes mellitus with unspecified complications: Secondary | ICD-10-CM

## 2023-10-05 DIAGNOSIS — F419 Anxiety disorder, unspecified: Secondary | ICD-10-CM

## 2023-10-05 DIAGNOSIS — Z7985 Long-term (current) use of injectable non-insulin antidiabetic drugs: Secondary | ICD-10-CM

## 2023-10-05 DIAGNOSIS — I10 Essential (primary) hypertension: Secondary | ICD-10-CM

## 2023-10-05 DIAGNOSIS — K219 Gastro-esophageal reflux disease without esophagitis: Secondary | ICD-10-CM

## 2023-10-05 LAB — MICROALBUMIN, URINE WAIVED
Creatinine, Urine Waived: 200 mg/dL (ref 10–300)
Microalb, Ur Waived: 80 mg/L — ABNORMAL HIGH (ref 0–19)

## 2023-10-05 LAB — BAYER DCA HB A1C WAIVED: HB A1C (BAYER DCA - WAIVED): 6.1 % — ABNORMAL HIGH (ref 4.8–5.6)

## 2023-10-05 MED ORDER — ATENOLOL 100 MG PO TABS
100.0000 mg | ORAL_TABLET | Freq: Every day | ORAL | 1 refills | Status: DC
Start: 2023-10-05 — End: 2024-04-16

## 2023-10-05 MED ORDER — DULOXETINE HCL 60 MG PO CPEP: 60.0000 mg | ORAL_CAPSULE | Freq: Every day | ORAL | 1 refills | Status: DC

## 2023-10-05 MED ORDER — CLONAZEPAM 1 MG PO TABS: ORAL_TABLET | ORAL | 0 refills | Status: DC

## 2023-10-05 MED ORDER — SEMAGLUTIDE (1 MG/DOSE) 4 MG/3ML ~~LOC~~ SOPN: 1.0000 mg | PEN_INJECTOR | SUBCUTANEOUS | 0 refills | Status: DC

## 2023-10-05 MED ORDER — ESOMEPRAZOLE MAGNESIUM 40 MG PO CPDR: DELAYED_RELEASE_CAPSULE | ORAL | 1 refills | Status: DC

## 2023-10-05 MED ORDER — BENAZEPRIL HCL 40 MG PO TABS: 40.0000 mg | ORAL_TABLET | Freq: Every day | ORAL | 1 refills | Status: DC

## 2023-10-05 MED ORDER — ATORVASTATIN CALCIUM 10 MG PO TABS: 10.0000 mg | ORAL_TABLET | ORAL | 0 refills | Status: AC

## 2023-10-05 NOTE — Assessment & Plan Note (Signed)
 Under good control on current regimen. Continue current regimen. Continue to monitor. Call with any concerns. Refills given.

## 2023-10-05 NOTE — Assessment & Plan Note (Signed)
 Under good control on current regimen. Continue current regimen. Continue to monitor. Call with any concerns. Refills given. Labs drawn today.

## 2023-10-05 NOTE — Assessment & Plan Note (Signed)
 Doing well with A1c of 6.1. Will increase his ozempic  to 1mg  and recheck in 3 months. Call with any concerns.

## 2023-10-05 NOTE — Progress Notes (Signed)
 BP 123/82 (BP Location: Left Arm, Patient Position: Sitting, Cuff Size: Large)   Pulse 76   Ht 5\' 10"  (1.778 m)   Wt 270 lb 12.8 oz (122.8 kg)   SpO2 98%   BMI 38.86 kg/m    Subjective:    Patient ID: Aaron Robinson, male    DOB: January 13, 1971, 53 y.o.   MRN: [id number]  HPI: Aaron Robinson is a 53 y.o. male presenting on 10/05/2023 for comprehensive medical examination. Current medical complaints include:  DIABETES Hypoglycemic episodes:no Polydipsia/polyuria: no Visual disturbance: no Chest pain: no Paresthesias: no Glucose Monitoring: no  Accucheck frequency: Not Checking Taking Insulin?: no Blood Pressure Monitoring: not checking Retinal Examination: Up to Date Foot Exam: Up to Date Diabetic Education: Completed Pneumovax: Not up to Date Influenza: Up to Date Aspirin: no  HYPERTENSION / HYPERLIPIDEMIA Satisfied with current treatment? yes Duration of hypertension: chronic BP monitoring frequency: not checking BP medication side effects: no Past BP meds: benazepril , atenolol  Duration of hyperlipidemia: chronic Cholesterol medication side effects: no Cholesterol supplements: none Past cholesterol medications: atorvastatin  Medication compliance: excellent compliance Aspirin: no Recent stressors: no Recurrent headaches: no Visual changes: no Palpitations: no Dyspnea: no Chest pain: no Lower extremity edema: no Dizzy/lightheaded: no  ANXIETY/STRESS- needs his klonopin  about 2x a month Duration: chronic Status:better Anxious mood: yes  Excessive worrying: yes Irritability: no  Sweating: no Nausea: no Palpitations:no Hyperventilation: no Panic attacks: no Agoraphobia: no  Obscessions/compulsions: no Depressed mood: no    03/15/2023    8:28 AM 02/10/2023   10:38 AM 11/17/2022   10:42 AM 08/16/2022    4:12 PM 05/11/2022    8:18 AM  Depression screen PHQ 2/9  Decreased Interest 1 2 0 0 0  Down, Depressed, Hopeless 1 2 0 0 0  PHQ - 2 Score 2 4  0 0 0  Altered sleeping 0 2 1 0 0  Tired, decreased energy 1 2 1 1  0  Change in appetite 0 1 0 0 0  Feeling bad or failure about yourself  1 2 0 0 0  Trouble concentrating 0 2 0 0 0  Moving slowly or fidgety/restless 0 0 0 0 0  Suicidal thoughts 0 0 0 0 0  PHQ-9 Score 4 13 2 1  0  Difficult doing work/chores Somewhat difficult Very difficult Not difficult at all Not difficult at all Not difficult at all   Anhedonia: no Weight changes: no Insomnia: no   Hypersomnia: no Fatigue/loss of energy: no Feelings of worthlessness: no Feelings of guilt: no Impaired concentration/indecisiveness: no Suicidal ideations: no  Crying spells: no Recent Stressors/Life Changes: no   Relationship problems: no   Family stress: no     Financial stress: no    Job stress: no    Recent death/loss: no  Interim Problems from his last visit: no  Depression Screen done today and results listed below:     03/15/2023    8:28 AM 02/10/2023   10:38 AM 11/17/2022   10:42 AM 08/16/2022    4:12 PM 05/11/2022    8:18 AM  Depression screen PHQ 2/9  Decreased Interest 1 2 0 0 0  Down, Depressed, Hopeless 1 2 0 0 0  PHQ - 2 Score 2 4 0 0 0  Altered sleeping 0 2 1 0 0  Tired, decreased energy 1 2 1 1  0  Change in appetite 0 1 0 0 0  Feeling bad or failure about yourself  1 2 0 0 0  Trouble  concentrating 0 2 0 0 0  Moving slowly or fidgety/restless 0 0 0 0 0  Suicidal thoughts 0 0 0 0 0  PHQ-9 Score 4 13 2 1  0  Difficult doing work/chores Somewhat difficult Very difficult Not difficult at all Not difficult at all Not difficult at all    Past Medical History:  Past Medical History:  Diagnosis Date   Anxiety    Depression    Diabetes mellitus without complication (HCC)    GERD (gastroesophageal reflux disease)    Hyperlipidemia    Hypertension     Surgical History:  Past Surgical History:  Procedure Laterality Date   COLONOSCOPY WITH PROPOFOL  N/A 07/11/2020   Procedure: COLONOSCOPY WITH PROPOFOL ;   Surgeon: Irby Mannan, MD;  Location: [facility] ENDOSCOPY;  Service: Endoscopy;  Laterality: N/A;   UPPER GASTROINTESTINAL ENDOSCOPY      Medications:  Current Outpatient Medications on File Prior to Visit  Medication Sig   sildenafil  (VIAGRA ) 100 MG tablet Take 0.5-1 tablets (50-100 mg total) by mouth daily as needed for erectile dysfunction.   No current facility-administered medications on file prior to visit.    Allergies:  No Known Allergies  Social History:  Social History   Socioeconomic History   Marital status: Single    Spouse name: Not on file   Number of children: Not on file   Years of education: Not on file   Highest education level: Not on file  Occupational History   Not on file  Tobacco Use   Smoking status: Former    Current packs/day: 0.00    Types: Cigarettes    Quit date: 05/24/2000    Years since quitting: 23.3   Smokeless tobacco: Former    Types: Engineer, drilling   Vaping status: Never Used  Substance and Sexual Activity   Alcohol use: Yes    Alcohol/week: 12.0 - 15.0 standard drinks of alcohol    Types: 12 - 15 Cans of beer per week   Drug use: No   Sexual activity: Yes  Other Topics Concern   Not on file  Social History Narrative   Not on file   Social Drivers of Health   Financial Resource Strain: Not on file  Food Insecurity: Not on file  Transportation Needs: Not on file  Physical Activity: Inactive (03/14/2023)   Exercise Vital Sign    Days of Exercise per Week: 1 day    Minutes of Exercise per Session: 0 min  Stress: No Stress Concern Present (03/14/2023)   [employer] of Occupational Health - Occupational Stress Questionnaire    Feeling of Stress : Only a little  Social Connections: Moderately Integrated (10/05/2023)   Social Connection and Isolation Panel [NHANES]    Frequency of Communication with Friends and Family: More than three times a week    Frequency of Social Gatherings with Friends and Family: More than  three times a week    Attends Religious Services: More than 4 times per year    Active Member of [employer] or Organizations: Yes    Attends Banker Meetings: More than 4 times per year    Marital Status: Never married  Intimate Partner Violence: Unknown (08/28/2021)   Received from [employer], [facility]   HITS    Physically Hurt: Not on file    Insult or Talk Down To: Not on file    Threaten Physical Harm: Not on file    Scream or Curse: Not on file  Social History   Tobacco Use  Smoking Status Former   Current packs/day: 0.00   Types: Cigarettes   Quit date: 05/24/2000   Years since quitting: 23.3  Smokeless Tobacco Former   Types: Chew   Social History   Substance and Sexual Activity  Alcohol Use Yes   Alcohol/week: 12.0 - 15.0 standard drinks of alcohol   Types: 12 - 15 Cans of beer per week    Family History:  Family History  Problem Relation Age of Onset   Hypertension Mother    Hypertension Father    Heart disease Father    Heart attack Father    Cancer Paternal Uncle        throat   Heart attack Paternal Grandfather     Past medical history, surgical history, medications, allergies, family history and social history reviewed with patient today and changes made to appropriate areas of the chart.   Review of Systems  Constitutional: Negative.   HENT: Negative.    Eyes: Negative.   Respiratory: Negative.    Cardiovascular: Negative.   Gastrointestinal:  Positive for heartburn. Negative for abdominal pain, blood in stool, constipation, diarrhea, melena, nausea and vomiting.  Genitourinary: Negative.   Musculoskeletal: Negative.   Skin: Negative.   Neurological: Negative.   Endo/Heme/Allergies: Negative.   Psychiatric/Behavioral:  Negative for depression, hallucinations, memory loss, substance abuse and suicidal ideas. The patient is nervous/anxious. The patient does not have insomnia.    All other ROS negative except what is listed above and  in the HPI.      Objective:     BP 123/82 (BP Location: Left Arm, Patient Position: Sitting, Cuff Size: Large)   Pulse 76   Ht 5\' 10"  (1.778 m)   Wt 270 lb 12.8 oz (122.8 kg)   SpO2 98%   BMI 38.86 kg/m   Wt Readings from Last 3 Encounters:  10/05/23 270 lb 12.8 oz (122.8 kg)  06/24/23 274 lb 3.2 oz (124.4 kg)  03/15/23 272 lb 9.6 oz (123.7 kg)    Physical Exam Vitals and nursing note reviewed.  Constitutional:      General: He is not in acute distress.    Appearance: Normal appearance. He is obese. He is not ill-appearing, toxic-appearing or diaphoretic.  HENT:     Head: Normocephalic and atraumatic.     Right Ear: Tympanic membrane, ear canal and external ear normal. There is no impacted cerumen.     Left Ear: Tympanic membrane, ear canal and external ear normal. There is no impacted cerumen.     Nose: Nose normal. No congestion or rhinorrhea.     Mouth/Throat:     Mouth: Mucous membranes are moist.     Pharynx: Oropharynx is clear. No oropharyngeal exudate or posterior oropharyngeal erythema.  Eyes:     General: No scleral icterus.       Right eye: No discharge.        Left eye: No discharge.     Extraocular Movements: Extraocular movements intact.     Conjunctiva/sclera: Conjunctivae normal.     Pupils: Pupils are equal, round, and reactive to light.  Neck:     Vascular: No carotid bruit.  Cardiovascular:     Rate and Rhythm: Normal rate and regular rhythm.     Pulses: Normal pulses.     Heart sounds: No murmur heard.    No friction rub. No gallop.  Pulmonary:     Effort: Pulmonary effort is normal. No respiratory distress.  Breath sounds: Normal breath sounds. No stridor. No wheezing, rhonchi or rales.  Chest:     Chest wall: No tenderness.  Abdominal:     General: Abdomen is flat. Bowel sounds are normal. There is no distension.     Palpations: Abdomen is soft. There is no mass.     Tenderness: There is no abdominal tenderness. There is no right CVA  tenderness, left CVA tenderness, guarding or rebound.     Hernia: No hernia is present.  Genitourinary:    Comments: Genital exam deferred with shared decision making Musculoskeletal:        General: No swelling, tenderness, deformity or signs of injury.     Cervical back: Normal range of motion and neck supple. No rigidity. No muscular tenderness.     Right lower leg: No edema.     Left lower leg: No edema.  Lymphadenopathy:     Cervical: No cervical adenopathy.  Skin:    General: Skin is warm and dry.     Capillary Refill: Capillary refill takes less than 2 seconds.     Coloration: Skin is not jaundiced or pale.     Findings: No bruising, erythema, lesion or rash.  Neurological:     General: No focal deficit present.     Mental Status: He is alert and oriented to person, place, and time.     Cranial Nerves: No cranial nerve deficit.     Sensory: No sensory deficit.     Motor: No weakness.     Coordination: Coordination normal.     Gait: Gait normal.     Deep Tendon Reflexes: Reflexes normal.  Psychiatric:        Mood and Affect: Mood normal.        Behavior: Behavior normal.        Thought Content: Thought content normal.        Judgment: Judgment normal.     Results for orders placed or performed in visit on 06/24/23  Bayer DCA Hb A1c Waived   Collection Time: 06/24/23 10:30 AM  Result Value Ref Range   HB A1C (BAYER DCA - WAIVED) 5.8 (H) 4.8 - 5.6 %      Assessment & Plan:   Problem List Items Addressed This Visit       Cardiovascular and Mediastinum   Hypertension   Under good control on current regimen. Continue current regimen. Continue to monitor. Call with any concerns. Refills given. Labs drawn today.        Relevant Medications   atenolol  (TENORMIN ) 100 MG tablet   atorvastatin  (LIPITOR) 10 MG tablet   benazepril  (LOTENSIN ) 40 MG tablet   Other Relevant Orders   Comprehensive metabolic panel with GFR     Digestive   GERD (gastroesophageal reflux  disease)   Under good control on current regimen. Continue current regimen. Continue to monitor. Call with any concerns. Refills given. Labs drawn today.       Relevant Medications   esomeprazole  (NEXIUM ) 40 MG capsule     Endocrine   Controlled diabetes mellitus type 2 with complications (HCC)   Doing well with A1c of 6.1. Will increase his ozempic  to 1mg  and recheck in 3 months. Call with any concerns.       Relevant Medications   atorvastatin  (LIPITOR) 10 MG tablet   benazepril  (LOTENSIN ) 40 MG tablet   Semaglutide , 1 MG/DOSE, 4 MG/3ML SOPN   Other Relevant Orders   Comprehensive metabolic panel with GFR   Microalbumin, Urine  Waived   Bayer DCA Hb A1c Waived     Other   Anxiety   Under good control on current regimen. Continue current regimen. Continue to monitor. Call with any concerns. Refills given.       Relevant Medications   clonazePAM  (KLONOPIN ) 1 MG tablet   DULoxetine  (CYMBALTA ) 60 MG capsule   Hyperlipidemia   Under good control on current regimen. Continue current regimen. Continue to monitor. Call with any concerns. Refills given. Labs drawn today.       Relevant Medications   atenolol  (TENORMIN ) 100 MG tablet   atorvastatin  (LIPITOR) 10 MG tablet   benazepril  (LOTENSIN ) 40 MG tablet   Other Relevant Orders   Comprehensive metabolic panel with GFR   Lipid Panel w/o Chol/HDL Ratio   Homocysteine   Major depressive disorder, recurrent episode, moderate (HCC)   Under good control on current regimen. Continue current regimen. Continue to monitor. Call with any concerns. Refills given.      Relevant Medications   DULoxetine  (CYMBALTA ) 60 MG capsule   Other Relevant Orders   Comprehensive metabolic panel with GFR   Other Visit Diagnoses       Routine general medical examination at a health care facility    -  Primary   Vaccines declined/up to date. Screening labs checked today. Colonoscopy up to date. Continue diet and exercise. Call with any concerns.    Relevant Orders   Comprehensive metabolic panel with GFR   CBC with Differential/Platelet   Lipid Panel w/o Chol/HDL Ratio   PSA   Microalbumin, Urine Waived   Bayer DCA Hb A1c Waived   TSH       LABORATORY TESTING:  Health maintenance labs ordered today as discussed above.   The natural history of prostate cancer and ongoing controversy regarding screening and potential treatment outcomes of prostate cancer has been discussed with the patient. The meaning of a false positive PSA and a false negative PSA has been discussed. He indicates understanding of the limitations of this screening test and wishes to proceed with screening PSA testing.   IMMUNIZATIONS:   - Tdap: Tetanus vaccination status reviewed: last tetanus booster within 10 years. - Influenza: Up to date - Pneumovax: Refused - Prevnar: Refused - COVID: Refused - HPV: Not applicable - Shingrix vaccine: Refused  SCREENING: - Colonoscopy: Up to date  Discussed with patient purpose of the colonoscopy is to detect colon cancer at curable precancerous or early stages   PATIENT COUNSELING:    Sexuality: Discussed sexually transmitted diseases, partner selection, use of condoms, avoidance of unintended pregnancy  and contraceptive alternatives.   Advised to avoid cigarette smoking.  I discussed with the patient that most people either abstain from alcohol or drink within safe limits (<=14/week and <=4 drinks/occasion for males, <=7/weeks and <= 3 drinks/occasion for females) and that the risk for alcohol disorders and other health effects rises proportionally with the number of drinks per week and how often a drinker exceeds daily limits.  Discussed cessation/primary prevention of drug use and availability of treatment for abuse.   Diet: Encouraged to adjust caloric intake to maintain  or achieve ideal body weight, to reduce intake of dietary saturated fat and total fat, to limit sodium intake by avoiding high sodium foods  and not adding table salt, and to maintain adequate dietary potassium and calcium  preferably from fresh fruits, vegetables, and low-fat dairy products.    stressed the importance of regular exercise  Injury prevention: Discussed safety belts, safety helmets,  smoke detector, smoking near bedding or upholstery.   Dental health: Discussed importance of regular tooth brushing, flossing, and dental visits.   Follow up plan: NEXT PREVENTATIVE PHYSICAL DUE IN 1 YEAR. Return in about 3 months (around 01/05/2024).

## 2023-10-07 ENCOUNTER — Ambulatory Visit: Payer: Self-pay | Admitting: Family Medicine

## 2023-10-07 LAB — CBC WITH DIFFERENTIAL/PLATELET
Basophils Absolute: 0.1 10*3/uL (ref 0.0–0.2)
Basos: 1 %
EOS (ABSOLUTE): 0.3 10*3/uL (ref 0.0–0.4)
Eos: 5 %
Hematocrit: 46.6 % (ref 37.5–51.0)
Hemoglobin: 15.1 g/dL (ref 13.0–17.7)
Immature Grans (Abs): 0 10*3/uL (ref 0.0–0.1)
Immature Granulocytes: 0 %
Lymphocytes Absolute: 1.5 10*3/uL (ref 0.7–3.1)
Lymphs: 27 %
MCH: 27 pg (ref 26.6–33.0)
MCHC: 32.4 g/dL (ref 31.5–35.7)
MCV: 83 fL (ref 79–97)
Monocytes Absolute: 0.5 10*3/uL (ref 0.1–0.9)
Monocytes: 8 %
Neutrophils Absolute: 3.2 10*3/uL (ref 1.4–7.0)
Neutrophils: 59 %
Platelets: 252 10*3/uL (ref 150–450)
RBC: 5.59 x10E6/uL (ref 4.14–5.80)
RDW: 13.8 % (ref 11.6–15.4)
WBC: 5.5 10*3/uL (ref 3.4–10.8)

## 2023-10-07 LAB — COMPREHENSIVE METABOLIC PANEL WITH GFR
ALT: 25 IU/L (ref 0–44)
AST: 20 IU/L (ref 0–40)
Albumin: 4.3 g/dL (ref 3.8–4.9)
Alkaline Phosphatase: 72 IU/L (ref 44–121)
BUN/Creatinine Ratio: 11 (ref 9–20)
BUN: 10 mg/dL (ref 6–24)
Bilirubin Total: 0.3 mg/dL (ref 0.0–1.2)
CO2: 22 mmol/L (ref 20–29)
Calcium: 9.1 mg/dL (ref 8.7–10.2)
Chloride: 98 mmol/L (ref 96–106)
Creatinine, Ser: 0.89 mg/dL (ref 0.76–1.27)
Globulin, Total: 3.2 g/dL (ref 1.5–4.5)
Glucose: 112 mg/dL — ABNORMAL HIGH (ref 70–99)
Potassium: 4.3 mmol/L (ref 3.5–5.2)
Sodium: 134 mmol/L (ref 134–144)
Total Protein: 7.5 g/dL (ref 6.0–8.5)
eGFR: 102 mL/min/{1.73_m2} (ref >59)

## 2023-10-07 LAB — TSH: TSH: 1.38 u[IU]/mL (ref 0.450–4.500)

## 2023-10-07 LAB — LIPID PANEL W/O CHOL/HDL RATIO
Cholesterol, Total: 146 mg/dL (ref 100–199)
HDL: 35 mg/dL — ABNORMAL LOW (ref >39)
LDL Chol Calc (NIH): 89 mg/dL (ref 0–99)
Triglycerides: 118 mg/dL (ref 0–149)
VLDL Cholesterol Cal: 22 mg/dL (ref 5–40)

## 2023-10-07 LAB — PSA: Prostate Specific Ag, Serum: 1 ng/mL (ref 0.0–4.0)

## 2023-10-07 LAB — HOMOCYSTEINE: Homocysteine: 8.5 umol/L (ref 0.0–14.5)

## 2023-10-12 LAB — HM DIABETES EYE EXAM

## 2024-01-10 ENCOUNTER — Ambulatory Visit: Admitting: Family Medicine

## 2024-01-16 ENCOUNTER — Other Ambulatory Visit: Payer: Self-pay | Admitting: Family Medicine

## 2024-01-17 NOTE — Telephone Encounter (Signed)
 A1C in date.  Requested Prescriptions  Pending Prescriptions Disp Refills   OZEMPIC , 1 MG/DOSE, 4 MG/3ML SOPN [Pharmacy Med Name: OZEMPIC  (1 MG/DOSE) 4MG /3ML SOPN] 9 mL 0    Sig: INJECT 1 MG AS DIRECTED ONCE A WEEK     Endocrinology:  Diabetes - GLP-1 Receptor Agonists - semaglutide  Failed - 01/17/2024  2:55 PM      Failed - HBA1C in normal range and within 180 days    HB A1C (BAYER DCA - WAIVED)  Date Value Ref Range Status  10/05/2023 6.1 (H) 4.8 - 5.6 % Final    Comment:             Prediabetes: 5.7 - 6.4          Diabetes: >6.4          Glycemic control for adults with diabetes: <7.0          Passed - Cr in normal range and within 360 days    Creatinine, Ser  Date Value Ref Range Status  10/05/2023 0.89 0.76 - 1.27 mg/dL Final         Passed - Valid encounter within last 6 months    Recent Outpatient Visits           3 months ago Routine general medical examination at a health care facility   [facility], Megan P, DO

## 2024-01-19 ENCOUNTER — Other Ambulatory Visit: Payer: Self-pay

## 2024-01-19 NOTE — Telephone Encounter (Signed)
 Copied from CRM [id number]. Topic: Clinical - Medication Question >> Jan 16, 2024  9:47 AM Marissa P wrote: Reason for CRM: Patient would like a referral on his Ozempic , going out of town in 2 hours and needs meds. >> Jan 16, 2024 12:50 PM Tobias L wrote: Patient calling and inquiring if ozempic  refill could be sent in as soon as possible.

## 2024-01-20 ENCOUNTER — Encounter: Payer: Self-pay | Admitting: Family Medicine

## 2024-01-20 ENCOUNTER — Ambulatory Visit (INDEPENDENT_AMBULATORY_CARE_PROVIDER_SITE_OTHER): Admitting: Family Medicine

## 2024-01-20 VITALS — BP 134/74 | HR 81 | Ht 70.0 in | Wt 270.0 lb

## 2024-01-20 DIAGNOSIS — F419 Anxiety disorder, unspecified: Secondary | ICD-10-CM

## 2024-01-20 DIAGNOSIS — E118 Type 2 diabetes mellitus with unspecified complications: Secondary | ICD-10-CM

## 2024-01-20 MED ORDER — SEMAGLUTIDE (2 MG/DOSE) 8 MG/3ML ~~LOC~~ SOPN
2.0000 mg | PEN_INJECTOR | SUBCUTANEOUS | 1 refills | Status: AC
Start: 2024-01-20 — End: ?

## 2024-01-20 MED ORDER — CLONAZEPAM 1 MG PO TABS
ORAL_TABLET | ORAL | 0 refills | Status: DC
Start: 2024-01-20 — End: 2024-04-16

## 2024-01-20 NOTE — Assessment & Plan Note (Signed)
 Doing great with A1c of 5.8. would like to increase ozempic  to 2mg  for weight loss. Follow up 3 months. Call with any concerns.

## 2024-01-20 NOTE — Assessment & Plan Note (Signed)
 Under good control on current regimen. Continue current regimen. Continue to monitor. Call with any concerns. Refills given for 3 months. Follow up 3 months.

## 2024-01-20 NOTE — Progress Notes (Signed)
 BP 134/74   Pulse 81   Ht 5' 10 (1.778 m)   Wt 270 lb (122.5 kg)   SpO2 98%   BMI 38.74 kg/m    Subjective:    Patient ID: Aaron Robinson, male    DOB: 03/30/1971, 52 y.o.   MRN: [id number]  HPI: Aaron Robinson is a 53 y.o. male  Chief Complaint  Patient presents with   Diabetes   DIABETES Hypoglycemic episodes:no Polydipsia/polyuria: no Visual disturbance: no Chest pain: no Paresthesias: no Glucose Monitoring: no  Accucheck frequency: Not Checking Taking Insulin?: no Blood Pressure Monitoring: rarely Retinal Examination: Up to Date Foot Exam: Up to Date Diabetic Education: Completed Pneumovax: Not up to Date Influenza: Not up to Date Aspirin: yes  ANXIETY/STRESS Duration: chronic Status:stable Anxious mood: yes  Excessive worrying: yes Irritability: yes  Sweating: no Nausea: no Palpitations:no Hyperventilation: no Panic attacks: no Agoraphobia: no  Obscessions/compulsions: no Depressed mood: no    03/15/2023    8:28 AM 02/10/2023   10:38 AM 11/17/2022   10:42 AM 08/16/2022    4:12 PM 05/11/2022    8:18 AM  Depression screen PHQ 2/9  Decreased Interest 1 2 0 0 0  Down, Depressed, Hopeless 1 2 0 0 0  PHQ - 2 Score 2 4 0 0 0  Altered sleeping 0 2 1 0 0  Tired, decreased energy 1 2 1 1  0  Change in appetite 0 1 0 0 0  Feeling bad or failure about yourself  1 2 0 0 0  Trouble concentrating 0 2 0 0 0  Moving slowly or fidgety/restless 0 0 0 0 0  Suicidal thoughts 0 0 0 0 0  PHQ-9 Score 4 13 2 1  0  Difficult doing work/chores Somewhat difficult Very difficult Not difficult at all Not difficult at all Not difficult at all   Anhedonia: no Weight changes: no Insomnia: yes   Hypersomnia: no Fatigue/loss of energy: no Feelings of worthlessness: no Feelings of guilt: no Impaired concentration/indecisiveness: no Suicidal ideations: no  Crying spells: no Recent Stressors/Life Changes: no   Relationship problems: no   Family stress: no      Financial stress: no    Job stress: no    Recent death/loss: no  Relevant past medical, surgical, family and social history reviewed and updated as indicated. Interim medical history since our last visit reviewed. Allergies and medications reviewed and updated.  Review of Systems  Constitutional: Negative.   Respiratory: Negative.    Cardiovascular: Negative.   Musculoskeletal: Negative.   Neurological: Negative.   Psychiatric/Behavioral: Negative.      Per HPI unless specifically indicated above     Objective:    BP 134/74   Pulse 81   Ht 5' 10 (1.778 m)   Wt 270 lb (122.5 kg)   SpO2 98%   BMI 38.74 kg/m   Wt Readings from Last 3 Encounters:  01/20/24 270 lb (122.5 kg)  10/05/23 270 lb 12.8 oz (122.8 kg)  06/24/23 274 lb 3.2 oz (124.4 kg)    Physical Exam Vitals and nursing note reviewed.  Constitutional:      General: He is not in acute distress.    Appearance: Normal appearance. He is obese. He is not ill-appearing, toxic-appearing or diaphoretic.  HENT:     Head: Normocephalic and atraumatic.     Right Ear: External ear normal.     Left Ear: External ear normal.     Nose: Nose normal.  Mouth/Throat:     Mouth: Mucous membranes are moist.     Pharynx: Oropharynx is clear.  Eyes:     General: No scleral icterus.       Right eye: No discharge.        Left eye: No discharge.     Extraocular Movements: Extraocular movements intact.     Conjunctiva/sclera: Conjunctivae normal.     Pupils: Pupils are equal, round, and reactive to light.  Cardiovascular:     Rate and Rhythm: Normal rate and regular rhythm.     Pulses: Normal pulses.     Heart sounds: Normal heart sounds. No murmur heard.    No friction rub. No gallop.  Pulmonary:     Effort: Pulmonary effort is normal. No respiratory distress.     Breath sounds: Normal breath sounds. No stridor. No wheezing, rhonchi or rales.  Chest:     Chest wall: No tenderness.  Musculoskeletal:        General:  Normal range of motion.     Cervical back: Normal range of motion and neck supple.  Skin:    General: Skin is warm and dry.     Capillary Refill: Capillary refill takes less than 2 seconds.     Coloration: Skin is not jaundiced or pale.     Findings: No bruising, erythema, lesion or rash.  Neurological:     General: No focal deficit present.     Mental Status: He is alert and oriented to person, place, and time. Mental status is at baseline.  Psychiatric:        Mood and Affect: Mood normal.        Behavior: Behavior normal.        Thought Content: Thought content normal.        Judgment: Judgment normal.     Results for orders placed or performed in visit on 10/05/23  Microalbumin, Urine Waived   Collection Time: 10/05/23  8:48 AM  Result Value Ref Range   Microalb, Ur Waived 80 (H) 0 - 19 mg/L   Creatinine, Urine Waived 200 10 - 300 mg/dL   Microalb/Creat Ratio 30-300 (H) <30 mg/g  Bayer DCA Hb A1c Waived   Collection Time: 10/05/23  8:48 AM  Result Value Ref Range   HB A1C (BAYER DCA - WAIVED) 6.1 (H) 4.8 - 5.6 %  Comprehensive metabolic panel with GFR   Collection Time: 10/05/23  8:50 AM  Result Value Ref Range   Glucose 112 (H) 70 - 99 mg/dL   BUN 10 6 - 24 mg/dL   Creatinine, Ser 9.10 0.76 - 1.27 mg/dL   eGFR 897 >40 fO/fpw/8.26   BUN/Creatinine Ratio 11 9 - 20   Sodium 134 134 - 144 mmol/L   Potassium 4.3 3.5 - 5.2 mmol/L   Chloride 98 96 - 106 mmol/L   CO2 22 20 - 29 mmol/L   Calcium  9.1 8.7 - 10.2 mg/dL   Total Protein 7.5 6.0 - 8.5 g/dL   Albumin 4.3 3.8 - 4.9 g/dL   Globulin, Total 3.2 1.5 - 4.5 g/dL   Bilirubin Total 0.3 0.0 - 1.2 mg/dL   Alkaline Phosphatase 72 44 - 121 IU/L   AST 20 0 - 40 IU/L   ALT 25 0 - 44 IU/L  CBC with Differential/Platelet   Collection Time: 10/05/23  8:50 AM  Result Value Ref Range   WBC 5.5 3.4 - 10.8 x10E3/uL   RBC 5.59 4.14 - 5.80 x10E6/uL   Hemoglobin 15.1  13.0 - 17.7 g/dL   Hematocrit 53.3 62.4 - 51.0 %   MCV 83 79 -  97 fL   MCH 27.0 26.6 - 33.0 pg   MCHC 32.4 31.5 - 35.7 g/dL   RDW 86.1 88.3 - 84.5 %   Platelets 252 150 - 450 x10E3/uL   Neutrophils 59 Not Estab. %   Lymphs 27 Not Estab. %   Monocytes 8 Not Estab. %   Eos 5 Not Estab. %   Basos 1 Not Estab. %   Neutrophils Absolute 3.2 1.4 - 7.0 x10E3/uL   Lymphocytes Absolute 1.5 0.7 - 3.1 x10E3/uL   Monocytes Absolute 0.5 0.1 - 0.9 x10E3/uL   EOS (ABSOLUTE) 0.3 0.0 - 0.4 x10E3/uL   Basophils Absolute 0.1 0.0 - 0.2 x10E3/uL   Immature Granulocytes 0 Not Estab. %   Immature Grans (Abs) 0.0 0.0 - 0.1 x10E3/uL  Lipid Panel w/o Chol/HDL Ratio   Collection Time: 10/05/23  8:50 AM  Result Value Ref Range   Cholesterol, Total 146 100 - 199 mg/dL   Triglycerides 881 0 - 149 mg/dL   HDL 35 (L) >60 mg/dL   VLDL Cholesterol Cal 22 5 - 40 mg/dL   LDL Chol Calc (NIH) 89 0 - 99 mg/dL  PSA   Collection Time: 10/05/23  8:50 AM  Result Value Ref Range   Prostate Specific Ag, Serum 1.0 0.0 - 4.0 ng/mL  TSH   Collection Time: 10/05/23  8:50 AM  Result Value Ref Range   TSH 1.380 0.450 - 4.500 uIU/mL  Homocysteine   Collection Time: 10/05/23  8:50 AM  Result Value Ref Range   Homocysteine 8.5 0.0 - 14.5 umol/L      Assessment & Plan:   Problem List Items Addressed This Visit       Endocrine   Controlled diabetes mellitus type 2 with complications (HCC) - Primary   Doing great with A1c of 5.8. would like to increase ozempic  to 2mg  for weight loss. Follow up 3 months. Call with any concerns.       Relevant Medications   Semaglutide , 2 MG/DOSE, 8 MG/3ML SOPN   Other Relevant Orders   Bayer DCA Hb A1c Waived     Other   Anxiety   Under good control on current regimen. Continue current regimen. Continue to monitor. Call with any concerns. Refills given for 3 months. Follow up 3 months.        Relevant Medications   clonazePAM  (KLONOPIN ) 1 MG tablet     Follow up plan: No follow-ups on file.

## 2024-01-21 LAB — BAYER DCA HB A1C WAIVED: HB A1C (BAYER DCA - WAIVED): 5.8 % — ABNORMAL HIGH (ref 4.8–5.6)

## 2024-01-26 ENCOUNTER — Encounter: Payer: Self-pay | Admitting: Family Medicine

## 2024-02-21 ENCOUNTER — Ambulatory Visit: Admitting: Family Medicine

## 2024-04-16 ENCOUNTER — Ambulatory Visit (INDEPENDENT_AMBULATORY_CARE_PROVIDER_SITE_OTHER): Admitting: Family Medicine

## 2024-04-16 ENCOUNTER — Encounter: Payer: Self-pay | Admitting: Family Medicine

## 2024-04-16 VITALS — BP 120/87 | Ht 70.0 in | Wt 269.6 lb

## 2024-04-16 DIAGNOSIS — E785 Hyperlipidemia, unspecified: Secondary | ICD-10-CM

## 2024-04-16 DIAGNOSIS — K219 Gastro-esophageal reflux disease without esophagitis: Secondary | ICD-10-CM

## 2024-04-16 DIAGNOSIS — F419 Anxiety disorder, unspecified: Secondary | ICD-10-CM

## 2024-04-16 DIAGNOSIS — I10 Essential (primary) hypertension: Secondary | ICD-10-CM

## 2024-04-16 DIAGNOSIS — E118 Type 2 diabetes mellitus with unspecified complications: Secondary | ICD-10-CM

## 2024-04-16 DIAGNOSIS — F331 Major depressive disorder, recurrent, moderate: Secondary | ICD-10-CM

## 2024-04-16 DIAGNOSIS — R5382 Chronic fatigue, unspecified: Secondary | ICD-10-CM

## 2024-04-16 LAB — BAYER DCA HB A1C WAIVED: HB A1C (BAYER DCA - WAIVED): 6 % — ABNORMAL HIGH (ref 4.8–5.6)

## 2024-04-16 MED ORDER — CLONAZEPAM 1 MG PO TABS: ORAL_TABLET | ORAL | 0 refills | Status: AC

## 2024-04-16 MED ORDER — ESOMEPRAZOLE MAGNESIUM 40 MG PO CPDR: DELAYED_RELEASE_CAPSULE | ORAL | 1 refills | Status: AC

## 2024-04-16 MED ORDER — ATENOLOL 100 MG PO TABS: 100.0000 mg | ORAL_TABLET | Freq: Every day | ORAL | 1 refills | Status: AC

## 2024-04-16 MED ORDER — BENAZEPRIL HCL 40 MG PO TABS: 40.0000 mg | ORAL_TABLET | Freq: Every day | ORAL | 1 refills | Status: AC

## 2024-04-16 MED ORDER — DULOXETINE HCL 60 MG PO CPEP: 60.0000 mg | ORAL_CAPSULE | Freq: Every day | ORAL | 1 refills | Status: AC

## 2024-04-16 NOTE — Assessment & Plan Note (Signed)
 Under good control on current regimen. Continue current regimen. Continue to monitor. Call with any concerns. Refills given. Labs drawn today.

## 2024-04-16 NOTE — Progress Notes (Signed)
 BP 120/87   Ht 5' 10 (1.778 m)   Wt 269 lb 9.6 oz (122.3 kg)   BMI 38.68 kg/m    Subjective:    Patient ID: Aaron Robinson, male    DOB: 10-Nov-1970, 53 y.o.   MRN: [id number]  HPI: Aaron Robinson is a 53 y.o. male  Chief Complaint  Patient presents with   Diabetes   DIABETES Hypoglycemic episodes:no Polydipsia/polyuria: no Visual disturbance: no Chest pain: no Paresthesias: no Glucose Monitoring: no  Accucheck frequency: Not Checking Taking Insulin?: no Blood Pressure Monitoring: rarely Retinal Examination: Up to Date Foot Exam: Up to Date Diabetic Education: Completed Pneumovax: Not up to Date Influenza: Not up to Date Aspirin: no  HYPERTENSION / HYPERLIPIDEMIA Satisfied with current treatment? yes Duration of hypertension: chronic BP monitoring frequency: not checking BP medication side effects: no Past BP meds: benazepril , atenolol  Duration of hyperlipidemia: chronic Cholesterol medication side effects: no Cholesterol supplements: none Past cholesterol medications: atorvastatin  Medication compliance: excellent compliance Aspirin: no Recent stressors: no Recurrent headaches: no Visual changes: no Palpitations: no Dyspnea: no Chest pain: no Lower extremity edema: no Dizzy/lightheaded: no  ANXIETY/DEPRESSION Duration: chronic Status:stable Anxious mood: yes  Excessive worrying: no Irritability: no  Sweating: no Nausea: no Palpitations:no Hyperventilation: no Panic attacks: no Agoraphobia: no  Obscessions/compulsions: no Depressed mood: no    04/16/2024   10:54 AM 03/15/2023    8:28 AM 02/10/2023   10:38 AM 11/17/2022   10:42 AM 08/16/2022    4:12 PM  Depression screen PHQ 2/9  Decreased Interest 0 1 2 0 0  Down, Depressed, Hopeless 0 1 2 0 0  PHQ - 2 Score 0 2 4 0 0  Altered sleeping 1 0 2 1 0  Tired, decreased energy 1 1 2 1 1   Change in appetite 0 0 1 0 0  Feeling bad or failure about yourself  0 1 2 0 0  Trouble  concentrating 0 0 2 0 0  Moving slowly or fidgety/restless 0 0 0 0 0  Suicidal thoughts 0 0 0 0 0  PHQ-9 Score 2 4  13  2  1    Difficult doing work/chores Somewhat difficult Somewhat difficult Very difficult Not difficult at all Not difficult at all     Data saved with a previous flowsheet row definition      04/16/2024   10:55 AM 03/15/2023    8:28 AM 02/10/2023   10:39 AM 11/17/2022   10:42 AM  GAD 7 : Generalized Anxiety Score  Nervous, Anxious, on Edge 1 2 3 1   Control/stop worrying 1 3 3 1   Worry too much - different things 1 2 3 1   Trouble relaxing 0 0 2 0  Restless 0 0 0 0  Easily annoyed or irritable 0 2 1 0  Afraid - awful might happen 0 1 2 1   Total GAD 7 Score 3 10 14 4   Anxiety Difficulty Somewhat difficult Somewhat difficult Very difficult Not difficult at all   Anhedonia: no Weight changes: no Insomnia: no   Hypersomnia: no Fatigue/loss of energy: yes Feelings of worthlessness: no Feelings of guilt: no Impaired concentration/indecisiveness: no Suicidal ideations: no  Crying spells: no Recent Stressors/Life Changes: no   Relationship problems: no   Family stress: no     Financial stress: no    Job stress: no    Recent death/loss: no   Relevant past medical, surgical, family and social history reviewed and updated as indicated. Interim medical history since our  last visit reviewed. Allergies and medications reviewed and updated.  Review of Systems  Constitutional: Negative.   Respiratory: Negative.    Cardiovascular: Negative.   Musculoskeletal: Negative.   Neurological: Negative.   Psychiatric/Behavioral: Negative.      Per HPI unless specifically indicated above     Objective:    BP 120/87   Ht 5' 10 (1.778 m)   Wt 269 lb 9.6 oz (122.3 kg)   BMI 38.68 kg/m   Wt Readings from Last 3 Encounters:  04/16/24 269 lb 9.6 oz (122.3 kg)  01/20/24 270 lb (122.5 kg)  10/05/23 270 lb 12.8 oz (122.8 kg)    Physical Exam Vitals and nursing note  reviewed.  Constitutional:      General: He is not in acute distress.    Appearance: Normal appearance. He is obese. He is not ill-appearing, toxic-appearing or diaphoretic.  HENT:     Head: Normocephalic and atraumatic.     Right Ear: External ear normal.     Left Ear: External ear normal.     Nose: Nose normal.     Mouth/Throat:     Mouth: Mucous membranes are moist.     Pharynx: Oropharynx is clear.  Eyes:     General: No scleral icterus.       Right eye: No discharge.        Left eye: No discharge.     Extraocular Movements: Extraocular movements intact.     Conjunctiva/sclera: Conjunctivae normal.     Pupils: Pupils are equal, round, and reactive to light.  Cardiovascular:     Rate and Rhythm: Normal rate and regular rhythm.     Pulses: Normal pulses.     Heart sounds: Normal heart sounds. No murmur heard.    No friction rub. No gallop.  Pulmonary:     Effort: Pulmonary effort is normal. No respiratory distress.     Breath sounds: Normal breath sounds. No stridor. No wheezing, rhonchi or rales.  Chest:     Chest wall: No tenderness.  Musculoskeletal:        General: Normal range of motion.     Cervical back: Normal range of motion and neck supple.  Skin:    General: Skin is warm and dry.     Capillary Refill: Capillary refill takes less than 2 seconds.     Coloration: Skin is not jaundiced or pale.     Findings: No bruising, erythema, lesion or rash.  Neurological:     General: No focal deficit present.     Mental Status: He is alert and oriented to person, place, and time. Mental status is at baseline.  Psychiatric:        Mood and Affect: Mood normal.        Behavior: Behavior normal.        Thought Content: Thought content normal.        Judgment: Judgment normal.     Results for orders placed or performed in visit on 01/30/24  HM DIABETES EYE EXAM   Collection Time: 10/12/23  9:57 AM  Result Value Ref Range   HM Diabetic Eye Exam No Retinopathy No  Retinopathy      Assessment & Plan:   Problem List Items Addressed This Visit       Cardiovascular and Mediastinum   Hypertension   Under good control on current regimen. Continue current regimen. Continue to monitor. Call with any concerns. Refills given. Labs drawn today.  Relevant Medications   atenolol  (TENORMIN ) 100 MG tablet   benazepril  (LOTENSIN ) 40 MG tablet   Other Relevant Orders   CBC with Differential/Platelet   Comprehensive metabolic panel with GFR     Digestive   GERD (gastroesophageal reflux disease)   Under good control on current regimen. Continue current regimen. Continue to monitor. Call with any concerns. Refills given. Labs drawn today.       Relevant Medications   esomeprazole  (NEXIUM ) 40 MG capsule     Endocrine   Controlled diabetes mellitus type 2 with complications (HCC)   Doing well with A1c of 6.0. Continue current regimen. Continue to monitor. Call with any concerns.       Relevant Medications   benazepril  (LOTENSIN ) 40 MG tablet     Other   Anxiety   Under good control on current regimen. Continue current regimen. Continue to monitor. Call with any concerns. Refills given. Labs drawn today.       Relevant Medications   clonazePAM  (KLONOPIN ) 1 MG tablet   DULoxetine  (CYMBALTA ) 60 MG capsule   Hyperlipidemia   Under good control on current regimen. Continue current regimen. Continue to monitor. Call with any concerns. Refills given. Labs drawn today.       Relevant Medications   atenolol  (TENORMIN ) 100 MG tablet   benazepril  (LOTENSIN ) 40 MG tablet   Other Relevant Orders   CBC with Differential/Platelet   Comprehensive metabolic panel with GFR   Lipid Panel w/o Chol/HDL Ratio   Major depressive disorder, recurrent episode, moderate (HCC)   Under good control on current regimen. Continue current regimen. Continue to monitor. Call with any concerns. Refills given. Labs drawn today.       Relevant Medications   DULoxetine   (CYMBALTA ) 60 MG capsule   Morbid obesity (HCC)   Encouraged diet and exercise with goal of losing 1-2lbs per week. Call with any concerns.       Other Visit Diagnoses       Controlled type 2 diabetes mellitus with complication, without long-term current use of insulin (HCC)    -  Primary   Relevant Medications   benazepril  (LOTENSIN ) 40 MG tablet   Other Relevant Orders   Bayer DCA Hb A1c Waived   CBC with Differential/Platelet   Comprehensive metabolic panel with GFR     Chronic fatigue       Labs drawn today. Await results.   Relevant Orders   Testosterone , free, total([facility])        Follow up plan: Return in about 3 months (around 07/17/2024).

## 2024-04-16 NOTE — Assessment & Plan Note (Signed)
Doing well with A1c of 6.0. Continue current regimen. Continue to monitor. Call with any concerns.

## 2024-04-16 NOTE — Assessment & Plan Note (Signed)
 Encouraged diet and exercise with goal of losing 1-2lbs per week. Call with any concerns.

## 2024-04-18 ENCOUNTER — Ambulatory Visit: Payer: Self-pay | Admitting: Family Medicine

## 2024-04-18 DIAGNOSIS — R7989 Other specified abnormal findings of blood chemistry: Secondary | ICD-10-CM

## 2024-04-18 LAB — CBC WITH DIFFERENTIAL/PLATELET
Basophils Absolute: 0 x10E3/uL (ref 0.0–0.2)
Basos: 0 %
EOS (ABSOLUTE): 0.3 x10E3/uL (ref 0.0–0.4)
Eos: 4 %
Hematocrit: 46.6 % (ref 37.5–51.0)
Hemoglobin: 14.8 g/dL (ref 13.0–17.7)
Immature Grans (Abs): 0 x10E3/uL (ref 0.0–0.1)
Immature Granulocytes: 0 %
Lymphocytes Absolute: 1.9 x10E3/uL (ref 0.7–3.1)
Lymphs: 27 %
MCH: 26.4 pg — ABNORMAL LOW (ref 26.6–33.0)
MCHC: 31.8 g/dL (ref 31.5–35.7)
MCV: 83 fL (ref 79–97)
Monocytes Absolute: 0.6 x10E3/uL (ref 0.1–0.9)
Monocytes: 9 %
Neutrophils Absolute: 4.2 x10E3/uL (ref 1.4–7.0)
Neutrophils: 60 %
Platelets: 289 x10E3/uL (ref 150–450)
RBC: 5.6 x10E6/uL (ref 4.14–5.80)
RDW: 14.1 % (ref 11.6–15.4)
WBC: 7 x10E3/uL (ref 3.4–10.8)

## 2024-04-18 LAB — COMPREHENSIVE METABOLIC PANEL WITH GFR
ALT: 22 IU/L (ref 0–44)
AST: 18 IU/L (ref 0–40)
Albumin: 4.4 g/dL (ref 3.8–4.9)
Alkaline Phosphatase: 70 IU/L (ref 47–123)
BUN/Creatinine Ratio: 16 (ref 9–20)
BUN: 15 mg/dL (ref 6–24)
Bilirubin Total: 0.3 mg/dL (ref 0.0–1.2)
CO2: 23 mmol/L (ref 20–29)
Calcium: 9 mg/dL (ref 8.7–10.2)
Chloride: 99 mmol/L (ref 96–106)
Creatinine, Ser: 0.96 mg/dL (ref 0.76–1.27)
Globulin, Total: 2.9 g/dL (ref 1.5–4.5)
Glucose: 95 mg/dL (ref 70–99)
Potassium: 4.2 mmol/L (ref 3.5–5.2)
Sodium: 135 mmol/L (ref 134–144)
Total Protein: 7.3 g/dL (ref 6.0–8.5)
eGFR: 95 mL/min/1.73 (ref >59)

## 2024-04-18 LAB — LIPID PANEL W/O CHOL/HDL RATIO
Cholesterol, Total: 146 mg/dL (ref 100–199)
HDL: 35 mg/dL — ABNORMAL LOW (ref >39)
LDL Chol Calc (NIH): 87 mg/dL (ref 0–99)
Triglycerides: 133 mg/dL (ref 0–149)
VLDL Cholesterol Cal: 24 mg/dL (ref 5–40)

## 2024-04-18 LAB — TESTOSTERONE, FREE, TOTAL, SHBG
Sex Hormone Binding: 16.4 nmol/L — ABNORMAL LOW (ref 19.3–76.4)
Testosterone, Free: 12.5 pg/mL (ref 7.2–24.0)
Testosterone: 238 ng/dL — ABNORMAL LOW (ref 264–916)

## 2024-04-23 NOTE — Progress Notes (Signed)
 Scheduled

## 2024-05-07 ENCOUNTER — Other Ambulatory Visit: Payer: Self-pay | Admitting: Family Medicine

## 2024-05-10 NOTE — Telephone Encounter (Signed)
 Duplicate request, L[facility] 04/16/24 FOR 90/1 RF.  Requested Prescriptions  Pending Prescriptions Disp Refills   DULoxetine  (CYMBALTA ) 60 MG capsule [Pharmacy Med Name: DULOXETINE  HCL 60MG  CPEP] 90 capsule 1    Sig: TAKE ONE CAPSULE BY MOUTH EVERY DAY     Psychiatry: Antidepressants - SNRI - duloxetine  Passed - 05/10/2024  8:30 AM      Passed - Cr in normal range and within 360 days    Creatinine, Ser  Date Value Ref Range Status  04/16/2024 0.96 0.76 - 1.27 mg/dL Final         Passed - eGFR is 30 or above and within 360 days    GFR calc Af Amer  Date Value Ref Range Status  06/25/2020 117 >59 mL/min/1.73 Final    Comment:    **In accordance with recommendations from the NKF-ASN Task force,**   [facility] is in the process of updating its eGFR calculation to the   2021 CKD-EPI creatinine equation that estimates kidney function   without a race variable.    GFR, Estimated  Date Value Ref Range Status  03/03/2021 >60 >60 mL/min Final    Comment:    (NOTE) Calculated using the CKD-EPI Creatinine Equation (2021)    eGFR  Date Value Ref Range Status  04/16/2024 95 >59 mL/min/1.73 Final         Passed - Completed PHQ-2 or PHQ-9 in the last 360 days      Passed - Last BP in normal range    BP Readings from Last 1 Encounters:  04/16/24 120/87         Passed - Valid encounter within last 6 months    Recent Outpatient Visits           3 weeks ago Controlled type 2 diabetes mellitus with complication, without long-term current use of insulin (HCC)   [place] [facility] [place], Megan P, DO   3 months ago Controlled type 2 diabetes mellitus with complication, without long-term current use of insulin (HCC)   [facility] [place], Megan P, DO   7 months ago Routine general medical examination at a health care facility   [facility], Megan P, DO

## 2024-05-25 ENCOUNTER — Other Ambulatory Visit

## 2024-05-25 DIAGNOSIS — R7989 Other specified abnormal findings of blood chemistry: Secondary | ICD-10-CM

## 2024-05-26 LAB — TESTOSTERONE, FREE, TOTAL, SHBG
Sex Hormone Binding: 19.4 nmol/L (ref 19.3–76.4)
Testosterone, Free: 11.3 pg/mL (ref 7.2–24.0)
Testosterone: 183 ng/dL — ABNORMAL LOW (ref 264–916)

## 2024-06-04 ENCOUNTER — Encounter: Payer: Self-pay | Admitting: Family Medicine

## 2024-06-04 ENCOUNTER — Ambulatory Visit: Payer: Self-pay | Admitting: Family Medicine

## 2024-06-04 DIAGNOSIS — E291 Testicular hypofunction: Secondary | ICD-10-CM | POA: Insufficient documentation

## 2024-06-04 MED ORDER — TESTOSTERONE 20.25 MG/ACT (1.62%) TD GEL: 1.0000 | Freq: Every day | TRANSDERMAL | 0 refills | Status: AC

## 2024-06-04 NOTE — Progress Notes (Signed)
 Called patient and left a message to call back to get scheduled.

## 2024-06-05 ENCOUNTER — Other Ambulatory Visit (HOSPITAL_COMMUNITY): Payer: Self-pay

## 2024-06-05 ENCOUNTER — Telehealth: Payer: Self-pay

## 2024-06-05 NOTE — Telephone Encounter (Signed)
 Pharmacy Patient Advocate Encounter   Received notification from [facility] KEY that prior authorization for Testosterone  1.62% gel is required/requested.   Insurance verification completed.   The patient is insured through [employer] [facility].   Per test claim: PA required; PA submitted to above mentioned insurance via Latent Key/confirmation #/EOC [id number] Status is pending

## 2024-06-06 ENCOUNTER — Other Ambulatory Visit (HOSPITAL_COMMUNITY): Payer: Self-pay

## 2024-06-06 NOTE — Telephone Encounter (Signed)
 Pharmacy Patient Advocate Encounter  Received notification from [facility] that Prior Authorization for Testosterone  1.62% gel has been APPROVED from 06/05/24 to 06/05/25. Ran test claim, Copay is $597.46. This test claim was processed through [facility]- copay amounts may vary at other pharmacies due to pharmacy/plan contracts, or as the patient moves through the different stages of their insurance plan.   PA #/Case ID/Reference #: # [id number]

## 2024-06-07 NOTE — Telephone Encounter (Signed)
 Called and spoke with patient. He states that he has to do this same thing with his Ozempic  and pay more for it at the beginning of the year. Patient states he doesn't think he has any other options and will just pay for the medication if he has too.

## 2024-06-07 NOTE — Telephone Encounter (Signed)
 Can we please find out what would be more reasonable cost for patient?

## 2024-06-07 NOTE — Telephone Encounter (Signed)
 Sounds good. Can we please get a 6 week follow up with me after he's started the medicine.

## 2024-06-08 NOTE — Telephone Encounter (Signed)
 Called and notified patient of Dr. Ferdie message. Patient has appointment already scheduled on 07/20/24.

## 2024-07-20 ENCOUNTER — Ambulatory Visit: Admitting: Family Medicine

## 2024-09-10 ENCOUNTER — Encounter: Admitting: Dermatology
